# Patient Record
Sex: Male | Born: 1970
Health system: Southern US, Community
[De-identification: ages and names within clinical notes are randomized; demographics above are authoritative.]

## PROBLEM LIST (undated history)

## (undated) DIAGNOSIS — I10 Essential (primary) hypertension: Secondary | ICD-10-CM

## (undated) DIAGNOSIS — M1990 Unspecified osteoarthritis, unspecified site: Secondary | ICD-10-CM

## (undated) DIAGNOSIS — Z87442 Personal history of urinary calculi: Secondary | ICD-10-CM

## (undated) DIAGNOSIS — K219 Gastro-esophageal reflux disease without esophagitis: Secondary | ICD-10-CM

---

## 2002-11-08 ENCOUNTER — Emergency Department (HOSPITAL_COMMUNITY): Admission: EM | Admit: 2002-11-08 | Discharge: 2002-11-08 | Payer: Self-pay | Admitting: Emergency Medicine

## 2011-07-08 DIAGNOSIS — R1110 Vomiting, unspecified: Secondary | ICD-10-CM | POA: Insufficient documentation

## 2013-03-08 DIAGNOSIS — R059 Cough, unspecified: Secondary | ICD-10-CM | POA: Insufficient documentation

## 2013-12-17 DIAGNOSIS — K219 Gastro-esophageal reflux disease without esophagitis: Secondary | ICD-10-CM | POA: Insufficient documentation

## 2013-12-17 DIAGNOSIS — J309 Allergic rhinitis, unspecified: Secondary | ICD-10-CM | POA: Insufficient documentation

## 2014-01-16 DIAGNOSIS — M25569 Pain in unspecified knee: Secondary | ICD-10-CM | POA: Insufficient documentation

## 2014-07-30 ENCOUNTER — Encounter (HOSPITAL_COMMUNITY): Payer: Self-pay | Admitting: *Deleted

## 2014-07-30 ENCOUNTER — Emergency Department (HOSPITAL_COMMUNITY)
Admission: EM | Admit: 2014-07-30 | Discharge: 2014-07-30 | Disposition: A | Discharge status: Home / Self Care | Payer: BLUE CROSS/BLUE SHIELD | Attending: Emergency Medicine | Admitting: Emergency Medicine

## 2014-07-30 ENCOUNTER — Emergency Department (HOSPITAL_COMMUNITY): Payer: BLUE CROSS/BLUE SHIELD

## 2014-07-30 DIAGNOSIS — Z79899 Other long term (current) drug therapy: Secondary | ICD-10-CM | POA: Diagnosis not present

## 2014-07-30 DIAGNOSIS — R079 Chest pain, unspecified: Secondary | ICD-10-CM | POA: Diagnosis present

## 2014-07-30 DIAGNOSIS — M549 Dorsalgia, unspecified: Secondary | ICD-10-CM | POA: Diagnosis not present

## 2014-07-30 DIAGNOSIS — E669 Obesity, unspecified: Secondary | ICD-10-CM | POA: Insufficient documentation

## 2014-07-30 DIAGNOSIS — K219 Gastro-esophageal reflux disease without esophagitis: Secondary | ICD-10-CM

## 2014-07-30 HISTORY — DX: Gastro-esophageal reflux disease without esophagitis: K21.9

## 2014-07-30 LAB — BASIC METABOLIC PANEL
ANION GAP: 5 (ref 5–15)
BUN: 14 mg/dL (ref 6–23)
CO2: 29 mmol/L (ref 19–32)
Calcium: 9.1 mg/dL (ref 8.4–10.5)
Chloride: 107 mmol/L (ref 96–112)
Creatinine, Ser: 0.98 mg/dL (ref 0.50–1.35)
GFR calc Af Amer: >90 mL/min (ref >90)
GFR calc non Af Amer: >90 mL/min (ref >90)
Glucose, Bld: 107 mg/dL — ABNORMAL HIGH (ref 70–99)
Potassium: 3.6 mmol/L (ref 3.5–5.1)
Sodium: 141 mmol/L (ref 135–145)

## 2014-07-30 LAB — CBC
HCT: 43.3 % (ref 39.0–52.0)
Hemoglobin: 14.5 g/dL (ref 13.0–17.0)
MCH: 28.8 pg (ref 26.0–34.0)
MCHC: 33.5 g/dL (ref 30.0–36.0)
MCV: 86.1 fL (ref 78.0–100.0)
Platelets: 235 10*3/uL (ref 150–400)
RBC: 5.03 MIL/uL (ref 4.22–5.81)
RDW: 13 % (ref 11.5–15.5)
WBC: 7.6 10*3/uL (ref 4.0–10.5)

## 2014-07-30 LAB — HEPATIC FUNCTION PANEL
ALK PHOS: 60 U/L (ref 39–117)
ALT: 25 U/L (ref 0–53)
AST: 22 U/L (ref 0–37)
Albumin: 4.3 g/dL (ref 3.5–5.2)
BILIRUBIN DIRECT: 0.1 mg/dL (ref 0.0–0.5)
BILIRUBIN TOTAL: 0.7 mg/dL (ref 0.3–1.2)
Indirect Bilirubin: 0.6 mg/dL (ref 0.3–0.9)
Total Protein: 7.4 g/dL (ref 6.0–8.3)

## 2014-07-30 LAB — TROPONIN I

## 2014-07-30 LAB — LIPASE, BLOOD: Lipase: 29 U/L (ref 11–59)

## 2014-07-30 MED ORDER — FAMOTIDINE IN NACL 20-0.9 MG/50ML-% IV SOLN
20.0000 mg | Freq: Once | INTRAVENOUS | Status: AC
  Administered 2014-07-30: 20 mg via INTRAVENOUS
  Filled 2014-07-30: qty 50

## 2014-07-30 MED ORDER — GI COCKTAIL ~~LOC~~
30.0000 mL | Freq: Once | ORAL | Status: AC
  Administered 2014-07-30: 30 mL via ORAL
  Filled 2014-07-30: qty 30

## 2014-07-30 MED ORDER — ONDANSETRON HCL 4 MG/2ML IJ SOLN
4.0000 mg | Freq: Once | INTRAMUSCULAR | Status: AC
  Administered 2014-07-30: 4 mg via INTRAVENOUS

## 2014-07-30 MED ORDER — PROMETHAZINE HCL 25 MG PO TABS: 25.0000 mg | ORAL_TABLET | Freq: Four times a day (QID) | ORAL | Status: DC | PRN

## 2014-07-30 MED ORDER — PROMETHAZINE HCL 12.5 MG PO TABS
25.0000 mg | ORAL_TABLET | Freq: Once | ORAL | Status: AC
  Administered 2014-07-30: 25 mg via ORAL

## 2014-07-30 MED ORDER — PROMETHAZINE HCL 12.5 MG PO TABS
ORAL_TABLET | ORAL | Status: AC
  Filled 2014-07-30: qty 2

## 2014-07-30 MED ORDER — ONDANSETRON HCL 4 MG/2ML IJ SOLN
INTRAMUSCULAR | Status: AC
  Filled 2014-07-30: qty 2

## 2014-07-30 NOTE — ED Provider Notes (Signed)
CSN: [id number]     Arrival date & time 07/30/14  0759 History   First MD Initiated Contact with Patient 07/30/14 [phone]     Chief Complaint  Patient presents with  . Chest Pain     (Consider location/radiation/quality/duration/timing/severity/associated sxs/prior Treatment) The history is provided by the patient and the spouse.   Cory Christensen is a 44 y.o. male with a history of GERD and family history of early CAD (father with MI at age 78) waking around 4:30 today with midsternal discomfort which radiates into his mid back.  He denies nausea, but had an episode of emesis without warning while driving to work.  He denies diaphoresis, shortness of breath, abdominal pain.  He has taken his prilosec this am and denies GERD symptoms.  His symptoms are somewhat positional, but constant. He reports lifting a washing machine yesterday with ease (and assistance).  Does not believe his symptoms are from muscle strain.  He has found no alleviators.  No surgical history, nonsmoker.    Past Medical History  Diagnosis Date  . GERD (gastroesophageal reflux disease)    History reviewed. No pertinent past surgical history. No family history on file. History  Substance Use Topics  . Smoking status: Never Smoker   . Smokeless tobacco: Not on file  . Alcohol Use: No    Review of Systems  Constitutional: Negative for fever.  HENT: Negative for congestion and sore throat.   Eyes: Negative.   Respiratory: Negative for cough, chest tightness and shortness of breath.   Cardiovascular: Positive for chest pain.  Gastrointestinal: Positive for vomiting. Negative for nausea and abdominal pain.  Genitourinary: Negative.   Musculoskeletal: Positive for back pain. Negative for joint swelling, arthralgias and neck pain.  Skin: Negative.  Negative for rash and wound.  Neurological: Negative for dizziness, weakness, light-headedness, numbness and headaches.  Psychiatric/Behavioral: Negative.        Allergies  Review of patient's allergies indicates no known allergies.  Home Medications   Prior to Admission medications   Medication Sig Start Date End Date Taking? Authorizing Provider  omeprazole (PRILOSEC OTC) 20 MG tablet Take 20 mg by mouth daily.   Yes Historical Provider, MD  promethazine (PHENERGAN) 25 MG tablet Take 1 tablet (25 mg total) by mouth every 6 (six) hours as needed for nausea or vomiting. 07/30/14   Evalee Jefferson, PA-C   BP 141/76 mmHg  Pulse 59  Temp(Src) 98.1 F (36.7 C) (Oral)  Resp 99  Ht 6\' 3"  (1.905 m)  Wt 310 lb (140.615 kg)  BMI 38.75 kg/m2  SpO2 99% Physical Exam  Constitutional: He appears well-developed and well-nourished.  obese  HENT:  Head: Normocephalic and atraumatic.  Eyes: Conjunctivae are normal.  Neck: Normal range of motion.  Cardiovascular: Normal rate, regular rhythm, normal heart sounds and intact distal pulses.   Pulmonary/Chest: Effort normal and breath sounds normal. He has no wheezes. He has no rales. He exhibits tenderness.  Mild ttp midsternum.  No reproducible back pain.  Abdominal: Soft. Bowel sounds are normal. There is no tenderness.  Musculoskeletal: Normal range of motion. He exhibits no edema or tenderness.  Neurological: He is alert.  Skin: Skin is warm and dry.  Psychiatric: He has a normal mood and affect.  Nursing note and vitals reviewed.   ED Course  Procedures (including critical care time) Labs Review Labs Reviewed  BASIC METABOLIC PANEL - Abnormal; Notable for the following:    Glucose, Bld 107 (*)    All  other components within normal limits  CBC  TROPONIN I  HEPATIC FUNCTION PANEL  LIPASE, BLOOD    Imaging Review Dg Chest 2 View  07/30/2014   CLINICAL DATA:  Central chest pain for 1 day  EXAM: CHEST  2 VIEW  COMPARISON:  None.  FINDINGS: Lungs are clear. Heart size and pulmonary vascularity are normal. No adenopathy. No bone lesions. No pneumothorax.  IMPRESSION: No edema or consolidation.    Electronically Signed   By: Lowella Grip III M.D.   On: 07/30/2014 08:43     EKG Interpretation   Date/Time:  Wednesday July 30 2014 08:04:05 EST Ventricular Rate:  70 PR Interval:  133 QRS Duration: 107 QT Interval:  407 QTC Calculation: 439 R Axis:   89 Text Interpretation:  Sinus rhythm Baseline wander in lead(s) V6 Confirmed  by ZAMMIT  MD, JOSEPH [phone]) on 07/30/2014 8:19:30 AM      MDM   Final diagnoses:  Gastroesophageal reflux disease, esophagitis presence not specified    Emesis x 1 after arrival, zofran given with no further vomiting, no nausea.  Still with discomfort, GI cocktail given with complete relief of chest and back sx.  Suspect GERD with possible esophagitis, especially given back sx.  He was encouraged to increase prilosec to bid.  Add maalox or mylanta per label instructions for breakthrough sx.  F/u with pcp if sx persist beyond the next week.  Labs reviewed and negative.  Pt was seen by Dr Roderic Palau during this visit.  Discharge VS not correct - respiratory rate reflects 99, pt in no respiratory distress during visit or at dc.    Evalee Jefferson, PA-C 07/30/14 2055  Maudry Diego, MD 08/01/14 [phone]

## 2014-07-30 NOTE — ED Notes (Signed)
Orders received from J. Idol, EDPa.

## 2014-07-30 NOTE — ED Notes (Signed)
Pt states pain between the shoulder blades when he woke up this morning. PT states he vomited x 1 on the way to work. States chest pain moves around according to which way he is lying. NAD.

## 2014-07-30 NOTE — ED Notes (Signed)
Pt reports increased pain with some position changes and mvmts.

## 2014-07-30 NOTE — ED Notes (Signed)
Pt up for discharge but has started vomiting again.

## 2014-07-30 NOTE — Discharge Instructions (Signed)
Gastroesophageal Reflux Disease, Adult Gastroesophageal reflux disease (GERD) happens when acid from your stomach flows up into the esophagus. When acid comes in contact with the esophagus, the acid causes soreness (inflammation) in the esophagus. Over time, GERD may create small holes (ulcers) in the lining of the esophagus. CAUSES   Increased body weight. This puts pressure on the stomach, making acid rise from the stomach into the esophagus.  Smoking. This increases acid production in the stomach.  Drinking alcohol. This causes decreased pressure in the lower esophageal sphincter (valve or ring of muscle between the esophagus and stomach), allowing acid from the stomach into the esophagus.  Late evening meals and a full stomach. This increases pressure and acid production in the stomach.  A malformed lower esophageal sphincter. Sometimes, no cause is found. SYMPTOMS   Burning pain in the lower part of the mid-chest behind the breastbone and in the mid-stomach area. This may occur twice a week or more often.  Trouble swallowing.  Sore throat.  Dry cough.  Asthma-like symptoms including chest tightness, shortness of breath, or wheezing. DIAGNOSIS  Your caregiver may be able to diagnose GERD based on your symptoms. In some cases, X-rays and other tests may be done to check for complications or to check the condition of your stomach and esophagus. TREATMENT  Your caregiver may recommend over-the-counter or prescription medicines to help decrease acid production. Ask your caregiver before starting or adding any new medicines.  HOME CARE INSTRUCTIONS   Change the factors that you can control. Ask your caregiver for guidance concerning weight loss, quitting smoking, and alcohol consumption.  Avoid foods and drinks that make your symptoms worse, such as:  Caffeine or alcoholic drinks.  Chocolate.  Peppermint or mint flavorings.  Garlic and onions.  Spicy foods.  Citrus fruits,  such as oranges, lemons, or limes.  Tomato-based foods such as sauce, chili, salsa, and pizza.  Fried and fatty foods.  Avoid lying down for the 3 hours prior to your bedtime or prior to taking a nap.  Eat small, frequent meals instead of large meals.  Wear loose-fitting clothing. Do not wear anything tight around your waist that causes pressure on your stomach.  Raise the head of your bed 6 to 8 inches with wood blocks to help you sleep. Extra pillows will not help.  Only take over-the-counter or prescription medicines for pain, discomfort, or fever as directed by your caregiver.  Do not take aspirin, ibuprofen, or other nonsteroidal anti-inflammatory drugs (NSAIDs). SEEK IMMEDIATE MEDICAL CARE IF:   You have pain in your arms, neck, jaw, teeth, or back.  Your pain increases or changes in intensity or duration.  You develop nausea, vomiting, or sweating (diaphoresis).  You develop shortness of breath, or you faint.  Your vomit is green, yellow, black, or looks like coffee grounds or blood.  Your stool is red, bloody, or black. These symptoms could be signs of other problems, such as heart disease, gastric bleeding, or esophageal bleeding. MAKE SURE YOU:   Understand these instructions.  Will watch your condition.  Will get help right away if you are not doing well or get worse. Document Released: 03/09/2005 Document Revised: 08/22/2011 Document Reviewed: 12/17/2010 [facility] Patient Information 2015 [place], [place]. This information is not intended to replace advice given to you by your health care provider. Make sure you discuss any questions you have with your health care provider.   I suggest taking your prilosec twice daily for the next 10 days.  Additionally,  you may add either maalox or mylanta per the label instructions for any breakthrough symptoms.  Followup with your doctor for a recheck if your symptoms persist beyond the next week.

## 2015-04-28 DIAGNOSIS — J209 Acute bronchitis, unspecified: Secondary | ICD-10-CM | POA: Insufficient documentation

## 2015-10-21 DIAGNOSIS — M25562 Pain in left knee: Secondary | ICD-10-CM | POA: Diagnosis not present

## 2015-10-21 DIAGNOSIS — Z6841 Body Mass Index (BMI) 40.0 and over, adult: Secondary | ICD-10-CM | POA: Diagnosis not present

## 2015-10-29 DIAGNOSIS — M25562 Pain in left knee: Secondary | ICD-10-CM | POA: Diagnosis not present

## 2015-11-12 DIAGNOSIS — M25562 Pain in left knee: Secondary | ICD-10-CM | POA: Diagnosis not present

## 2015-11-12 DIAGNOSIS — G8929 Other chronic pain: Secondary | ICD-10-CM | POA: Diagnosis not present

## 2015-11-18 DIAGNOSIS — M84362A Stress fracture, left tibia, initial encounter for fracture: Secondary | ICD-10-CM | POA: Diagnosis not present

## 2015-11-18 DIAGNOSIS — M25562 Pain in left knee: Secondary | ICD-10-CM | POA: Diagnosis not present

## 2015-11-21 DIAGNOSIS — T24111A Burn of first degree of right thigh, initial encounter: Secondary | ICD-10-CM | POA: Diagnosis not present

## 2015-11-21 DIAGNOSIS — T23202A Burn of second degree of left hand, unspecified site, initial encounter: Secondary | ICD-10-CM | POA: Diagnosis not present

## 2015-11-21 DIAGNOSIS — Z79899 Other long term (current) drug therapy: Secondary | ICD-10-CM | POA: Diagnosis not present

## 2015-11-21 DIAGNOSIS — T2010XA Burn of first degree of head, face, and neck, unspecified site, initial encounter: Secondary | ICD-10-CM | POA: Diagnosis not present

## 2015-11-21 DIAGNOSIS — T23232A Burn of second degree of multiple left fingers (nail), not including thumb, initial encounter: Secondary | ICD-10-CM | POA: Diagnosis not present

## 2015-11-24 DIAGNOSIS — T2020XA Burn of second degree of head, face, and neck, unspecified site, initial encounter: Secondary | ICD-10-CM | POA: Insufficient documentation

## 2015-11-24 DIAGNOSIS — T23202A Burn of second degree of left hand, unspecified site, initial encounter: Secondary | ICD-10-CM | POA: Diagnosis not present

## 2015-11-24 DIAGNOSIS — T24231A Burn of second degree of right lower leg, initial encounter: Secondary | ICD-10-CM | POA: Diagnosis not present

## 2015-11-24 DIAGNOSIS — T23232A Burn of second degree of multiple left fingers (nail), not including thumb, initial encounter: Secondary | ICD-10-CM | POA: Diagnosis not present

## 2015-12-08 DIAGNOSIS — T23232A Burn of second degree of multiple left fingers (nail), not including thumb, initial encounter: Secondary | ICD-10-CM | POA: Diagnosis not present

## 2015-12-08 DIAGNOSIS — T24231A Burn of second degree of right lower leg, initial encounter: Secondary | ICD-10-CM | POA: Diagnosis not present

## 2015-12-08 DIAGNOSIS — T2020XA Burn of second degree of head, face, and neck, unspecified site, initial encounter: Secondary | ICD-10-CM | POA: Diagnosis not present

## 2015-12-08 DIAGNOSIS — T23202A Burn of second degree of left hand, unspecified site, initial encounter: Secondary | ICD-10-CM | POA: Diagnosis not present

## 2015-12-16 DIAGNOSIS — M25562 Pain in left knee: Secondary | ICD-10-CM | POA: Diagnosis not present

## 2015-12-16 DIAGNOSIS — M84362D Stress fracture, left tibia, subsequent encounter for fracture with routine healing: Secondary | ICD-10-CM | POA: Diagnosis not present

## 2016-02-04 DIAGNOSIS — K210 Gastro-esophageal reflux disease with esophagitis: Secondary | ICD-10-CM | POA: Diagnosis not present

## 2016-02-04 DIAGNOSIS — R05 Cough: Secondary | ICD-10-CM | POA: Diagnosis not present

## 2016-02-04 DIAGNOSIS — Z6841 Body Mass Index (BMI) 40.0 and over, adult: Secondary | ICD-10-CM | POA: Diagnosis not present

## 2016-03-16 DIAGNOSIS — Z23 Encounter for immunization: Secondary | ICD-10-CM | POA: Diagnosis not present

## 2016-04-05 ENCOUNTER — Encounter (HOSPITAL_COMMUNITY): Payer: Self-pay | Admitting: Emergency Medicine

## 2016-04-05 ENCOUNTER — Emergency Department (HOSPITAL_COMMUNITY)
Admission: EM | Admit: 2016-04-05 | Discharge: 2016-04-05 | Disposition: A | Discharge status: Home / Self Care | Payer: BLUE CROSS/BLUE SHIELD | Attending: Emergency Medicine | Admitting: Emergency Medicine

## 2016-04-05 ENCOUNTER — Emergency Department (HOSPITAL_COMMUNITY): Payer: BLUE CROSS/BLUE SHIELD

## 2016-04-05 DIAGNOSIS — N200 Calculus of kidney: Secondary | ICD-10-CM | POA: Insufficient documentation

## 2016-04-05 DIAGNOSIS — Z79899 Other long term (current) drug therapy: Secondary | ICD-10-CM | POA: Diagnosis not present

## 2016-04-05 DIAGNOSIS — R109 Unspecified abdominal pain: Secondary | ICD-10-CM | POA: Diagnosis present

## 2016-04-05 DIAGNOSIS — N201 Calculus of ureter: Secondary | ICD-10-CM | POA: Diagnosis not present

## 2016-04-05 LAB — URINALYSIS, ROUTINE W REFLEX MICROSCOPIC
GLUCOSE, UA: NEGATIVE mg/dL
Ketones, ur: NEGATIVE mg/dL
LEUKOCYTES UA: NEGATIVE
Nitrite: NEGATIVE
PH: 6 (ref 5.0–8.0)
Protein, ur: 30 mg/dL — AB
Specific Gravity, Urine: >1.03 — ABNORMAL HIGH (ref 1.005–1.030)

## 2016-04-05 LAB — URINE MICROSCOPIC-ADD ON: Squamous Epithelial / LPF: NONE SEEN

## 2016-04-05 LAB — COMPREHENSIVE METABOLIC PANEL
ALBUMIN: 4.5 g/dL (ref 3.5–5.0)
ALT: 28 U/L (ref 17–63)
AST: 27 U/L (ref 15–41)
Alkaline Phosphatase: 58 U/L (ref 38–126)
Anion gap: 10 (ref 5–15)
BUN: 15 mg/dL (ref 6–20)
CO2: 24 mmol/L (ref 22–32)
Calcium: 9.1 mg/dL (ref 8.9–10.3)
Chloride: 104 mmol/L (ref 101–111)
Creatinine, Ser: 1.22 mg/dL (ref 0.61–1.24)
GFR calc Af Amer: >60 mL/min (ref >60)
GFR calc non Af Amer: >60 mL/min (ref >60)
GLUCOSE: 166 mg/dL — AB (ref 65–99)
Potassium: 3.5 mmol/L (ref 3.5–5.1)
SODIUM: 138 mmol/L (ref 135–145)
Total Bilirubin: 0.8 mg/dL (ref 0.3–1.2)
Total Protein: 7.8 g/dL (ref 6.5–8.1)

## 2016-04-05 LAB — CBC WITH DIFFERENTIAL/PLATELET
BASOS ABS: 0 10*3/uL (ref 0.0–0.1)
Basophils Relative: 0 %
EOS ABS: 0.2 10*3/uL (ref 0.0–0.7)
Eosinophils Relative: 2 %
HEMATOCRIT: 44.7 % (ref 39.0–52.0)
HEMOGLOBIN: 15.3 g/dL (ref 13.0–17.0)
Lymphocytes Relative: 18 %
Lymphs Abs: 1.9 10*3/uL (ref 0.7–4.0)
MCH: 29.1 pg (ref 26.0–34.0)
MCHC: 34.2 g/dL (ref 30.0–36.0)
MCV: 85.1 fL (ref 78.0–100.0)
MONO ABS: 0.8 10*3/uL (ref 0.1–1.0)
MONOS PCT: 8 %
NEUTROS PCT: 72 %
Neutro Abs: 7.4 10*3/uL (ref 1.7–7.7)
Platelets: 274 10*3/uL (ref 150–400)
RBC: 5.25 MIL/uL (ref 4.22–5.81)
RDW: 13.1 % (ref 11.5–15.5)
WBC: 10.3 10*3/uL (ref 4.0–10.5)

## 2016-04-05 LAB — LIPASE, BLOOD: Lipase: 25 U/L (ref 11–51)

## 2016-04-05 MED ORDER — IBUPROFEN 600 MG PO TABS: 600.0000 mg | ORAL_TABLET | Freq: Four times a day (QID) | ORAL | 1 refills | Status: DC | PRN

## 2016-04-05 MED ORDER — MORPHINE SULFATE (PF) 2 MG/ML IV SOLN: 1.0000 mg | Freq: Once | INTRAVENOUS | Status: DC

## 2016-04-05 MED ORDER — MORPHINE SULFATE (PF) 2 MG/ML IV SOLN
4.0000 mg | Freq: Once | INTRAVENOUS | Status: AC
  Administered 2016-04-05: 4 mg via INTRAVENOUS
  Filled 2016-04-05: qty 2

## 2016-04-05 MED ORDER — ONDANSETRON HCL 4 MG/2ML IJ SOLN
4.0000 mg | Freq: Once | INTRAMUSCULAR | Status: AC
  Administered 2016-04-05: 4 mg via INTRAVENOUS
  Filled 2016-04-05: qty 2

## 2016-04-05 MED ORDER — MORPHINE SULFATE (PF) 2 MG/ML IV SOLN: 2.0000 mg | Freq: Once | INTRAVENOUS | Status: DC

## 2016-04-05 MED ORDER — SODIUM CHLORIDE 0.9 % IV SOLN
1000.0000 mL | Freq: Once | INTRAVENOUS | Status: AC
  Administered 2016-04-05: 1000 mL via INTRAVENOUS

## 2016-04-05 MED ORDER — KETOROLAC TROMETHAMINE 30 MG/ML IJ SOLN
30.0000 mg | Freq: Once | INTRAMUSCULAR | Status: AC
  Administered 2016-04-05: 30 mg via INTRAVENOUS
  Filled 2016-04-05: qty 1

## 2016-04-05 MED ORDER — OXYCODONE-ACETAMINOPHEN 5-325 MG PO TABS: 1.0000 | ORAL_TABLET | ORAL | 0 refills | Status: DC | PRN

## 2016-04-05 MED ORDER — PROMETHAZINE HCL 25 MG/ML IJ SOLN
25.0000 mg | Freq: Once | INTRAMUSCULAR | Status: AC
  Administered 2016-04-05: 25 mg via INTRAVENOUS
  Filled 2016-04-05: qty 1

## 2016-04-05 MED ORDER — TAMSULOSIN HCL 0.4 MG PO CAPS: 0.4000 mg | ORAL_CAPSULE | Freq: Every day | ORAL | 0 refills | Status: DC

## 2016-04-05 MED ORDER — ONDANSETRON 8 MG PO TBDP: 8.0000 mg | ORAL_TABLET | Freq: Three times a day (TID) | ORAL | 0 refills | Status: DC | PRN

## 2016-04-05 NOTE — ED Triage Notes (Signed)
Patient complains of lower abdominal pain. States pain radiates to left lower back and groin. States vomiting 6 times this morning. States unable to void. Denies diarrhea, fever.

## 2016-04-05 NOTE — ED Notes (Signed)
Pt given ginger ale.

## 2016-04-05 NOTE — ED Provider Notes (Signed)
[facility] DEPT Provider Note   CSN: [id number] Arrival date & time: 04/05/16  0700     History   Chief Complaint Chief Complaint  Patient presents with  . Abdominal Pain    HPI Cory Christensen is a 45 year old man with history of GERD.  HPI  He presents with abdominal pain. Woke up this morning with pain and nausea, (nonbloody, nonbilious) vomiting. Pain is sharp and located in left flank and groin. Intermittent. No relieving factors. Riding in the car made the pain worse. Never had pain like this before. He is having trouble voiding. Some testicular pain but most pain is in flank. He was in [place] last week. No sick contacts.   Past Medical History:  Diagnosis Date  . GERD (gastroesophageal reflux disease)     History reviewed. No pertinent surgical history.   Home Medications    Prior to Admission medications   Medication Sig Start Date End Date Taking? Authorizing Provider  ibuprofen (ADVIL,MOTRIN) 600 MG tablet Take 1 tablet (600 mg total) by mouth every 6 (six) hours as needed. 04/05/16   Milagros Loll, MD  omeprazole (PRILOSEC OTC) 20 MG tablet Take 20 mg by mouth daily.    Historical Provider, MD  ondansetron (ZOFRAN-ODT) 8 MG disintegrating tablet Take 1 tablet (8 mg total) by mouth every 8 (eight) hours as needed for nausea or vomiting. 04/05/16   Milagros Loll, MD  oxyCODONE-acetaminophen (PERCOCET/ROXICET) 5-325 MG tablet Take 1-2 tablets by mouth every 4 (four) hours as needed for severe pain. 04/05/16   Milagros Loll, MD  promethazine (PHENERGAN) 25 MG tablet Take 1 tablet (25 mg total) by mouth every 6 (six) hours as needed for nausea or vomiting. 07/30/14   Evalee Jefferson, PA-C  tamsulosin (FLOMAX) 0.4 MG CAPS capsule Take 1 capsule (0.4 mg total) by mouth daily after supper. 04/05/16   Milagros Loll, MD   Family History No family history on file.  Social History Social History  Substance Use Topics  . Smoking status: Never Smoker  .  Smokeless tobacco: Never Used  . Alcohol use No     Allergies   Review of patient's allergies indicates no known allergies.   Review of Systems Review of Systems Constitutional: +subjective fevers Eyes: no vision changes Ears, nose, mouth, throat, and face: no cough Respiratory: no shortness of breath Cardiovascular: no chest pain Gastrointestinal: see HPI, no constipation, no diarrhea Genitourinary: no dysuria, no hematuria Integument: no rash Hematologic/lymphatic: no bleeding/bruising, no edema Musculoskeletal: no arthralgias, no myalgias Neurological: no paresthesias, no weakness   Physical Exam Updated Vital Signs BP 143/93 (BP Location: Left Arm)   Pulse 77   Temp 97.6 F (36.4 C) (Oral)   Resp 18   Ht 6\' 3"  (1.905 m)   Wt (!) 140.6 kg   SpO2 92%   BMI 38.75 kg/m   Physical Exam Christensen Apperance: NAD Head: Normocephalic, atraumatic Eyes: PERRL, EOMI, anicteric sclera Ears: Normal external ear canal Nose: Nares normal, septum midline, mucosa normal Throat: Lips, mucosa and tongue normal  Neck: Supple, trachea midline Back: No tenderness or bony abnormality  Lungs: Clear to auscultation bilaterally. No wheezes, rhonchi or rales. Breathing comfortably Chest Wall: Nontender, no deformity Heart: Regular rate and rhythm, no murmur/rub/gallop Abdomen: Soft, tender to palpation left abdomen/flank, nondistended, no rebound Extremities: Normal, atraumatic, warm and well perfused, no edema Pulses: 2+ throughout Skin: No rashes or lesions Neurologic: Alert and oriented x 3. CNII-XII intact. Normal strength and sensation  ED Treatments / Results  Labs (all labs ordered are listed, but only abnormal results are displayed) Labs Reviewed  COMPREHENSIVE METABOLIC PANEL - Abnormal; Notable for the following:       Result Value   Glucose, Bld 166 (*)    All other components within normal limits  URINALYSIS, ROUTINE W REFLEX MICROSCOPIC (NOT AT [facility]) - Abnormal;  Notable for the following:    Specific Gravity, Urine >1.030 (*)    Hgb urine dipstick LARGE (*)    Bilirubin Urine SMALL (*)    Protein, ur 30 (*)    All other components within normal limits  URINE MICROSCOPIC-ADD ON - Abnormal; Notable for the following:    Bacteria, UA FEW (*)    All other components within normal limits  CBC WITH DIFFERENTIAL/PLATELET  LIPASE, BLOOD    Radiology Ct Renal Stone Study  Result Date: 04/05/2016 CLINICAL DATA:  Low back pain that  radiates to back. EXAM: CT ABDOMEN AND PELVIS WITHOUT CONTRAST TECHNIQUE: Multidetector CT imaging of the abdomen and pelvis was performed following the standard protocol without IV contrast. COMPARISON:  None. FINDINGS: Lower chest: Lung bases are clear. Hepatobiliary: No focal hepatic lesions noncontrast gallstones without evidence acute cholecystitis. Pancreas: Pancreas is normal. No ductal dilatation. No pancreatic inflammation. Spleen: Normal spleen Adrenals/Urinary Tract: Adrenal glands, kidneys, adrenal glands are normal. There is mild renal edema on the LEFT. Mild hydroureter on the LEFT. This secondary to partially obstructing calculus in the distal LEFT ureter measuring 3 mm. This calculus is approximately 3 cm from the vascular junction. No bladder calculi Stomach/Bowel: Stomach, small bowel, appendix, and cecum are normal. The colon and rectosigmoid colon are normal. Vascular/Lymphatic: Abdominal aorta is normal caliber. There is no retroperitoneal or periportal lymphadenopathy. No pelvic lymphadenopathy. Reproductive: Prostate normal. Other: No free fluid the pelvis. Musculoskeletal: No aggressive osseous lesion. IMPRESSION: 1. Partially obstructing calculus within distal LEFT ureter approximately 3 cm from the vesicoureteral junction. 2. No nephrolithiasis. Electronically Signed   By: Suzy Bouchard M.D.   On: 04/05/2016 08:57   Procedures  Medications Ordered in ED Medications  ketorolac (TORADOL) 30 MG/ML injection  30 mg (30 mg Intravenous Given 04/05/16 0729)  promethazine (PHENERGAN) injection 25 mg (25 mg Intravenous Given 04/05/16 0730)  0.9 %  sodium chloride infusion (0 mLs Intravenous Stopped 04/05/16 0905)  morphine 2 MG/ML injection 4 mg (4 mg Intravenous Given 04/05/16 0744)  ondansetron (ZOFRAN) injection 4 mg (4 mg Intravenous Given 04/05/16 0849)  ondansetron (ZOFRAN) injection 4 mg (4 mg Intravenous Given 04/05/16 0951)     Initial Impression / Assessment and Plan / ED Course  I have reviewed the triage vital signs and the nursing notes.  Pertinent labs & imaging results that were available during my care of the patient were reviewed by me and considered in my medical decision making (see chart for details).  Clinical Course  45 year old man with history of GERD presenting with sudden onset of nausea/vomiting, left flank/groin pain that started this morning. Tender to palpation long left flank. CMP and lipase unremarkable. CBC unremarkable. Patient is afebrile with no leukocytosis. No sepsis. UA with negative leukocytes and nitrites. Numerous RBC. CT renal protocol with partially obstructing, 45mm calculus within distal left ureter 3cm from vesicoureteral junction. Pain was controlled with 30mg  of IV Toradol, 4mg  of IV morphine. He received 25mg  of phenergan and 8mg  of Zofran. He was able to tolerate PO intake.   Final Clinical Impressions(s) / ED Diagnoses   Final diagnoses:  Renal  stone  59mm calculus at distal left ureter. Will likely pass spontaneously. Discharge home. Will have patient strain urine. Flomax 0.4mg  daily to aid in passage of stone. Ibuprofen for pain. Percocet for breakthrough pain. Zofran for nausea. Follow up with urology.  New Prescriptions New Prescriptions   IBUPROFEN (ADVIL,MOTRIN) 600 MG TABLET    Take 1 tablet (600 mg total) by mouth every 6 (six) hours as needed.   ONDANSETRON (ZOFRAN-ODT) 8 MG DISINTEGRATING TABLET    Take 1 tablet (8 mg total) by mouth every 8  (eight) hours as needed for nausea or vomiting.   OXYCODONE-ACETAMINOPHEN (PERCOCET/ROXICET) 5-325 MG TABLET    Take 1-2 tablets by mouth every 4 (four) hours as needed for severe pain.   TAMSULOSIN (FLOMAX) 0.4 MG CAPS CAPSULE    Take 1 capsule (0.4 mg total) by mouth daily after supper.     Milagros Loll, MD 04/05/16 [place], MD 04/05/16 [phone]

## 2016-04-05 NOTE — ED Notes (Signed)
Pt keeping fluids down at this time.

## 2016-04-05 NOTE — Discharge Instructions (Addendum)
Strain urine through the provided strainer. The stone may be as small as a grain of salt. Take this to your doctor. Take tamsulosin (Flomax) in the evenings. This will the stone to pass.  Take ibuprofen every 6 hours for your pain. If your pain is not controlled by the ibuprofen you can take Percocet (oxycodone-acetaminophen) in between. You may take Zofran (ondansetron) for nausea/vomiting.

## 2016-04-05 NOTE — ED Notes (Signed)
Pt given strainer and a urine cup to collect any stone that may pass.

## 2016-07-15 DIAGNOSIS — Z0000 Encounter for general adult medical examination without abnormal findings: Secondary | ICD-10-CM | POA: Diagnosis not present

## 2016-07-17 DIAGNOSIS — K2100 Gastro-esophageal reflux disease with esophagitis, without bleeding: Secondary | ICD-10-CM | POA: Insufficient documentation

## 2016-07-18 DIAGNOSIS — Z6841 Body Mass Index (BMI) 40.0 and over, adult: Secondary | ICD-10-CM | POA: Diagnosis not present

## 2016-07-18 DIAGNOSIS — Z0000 Encounter for general adult medical examination without abnormal findings: Secondary | ICD-10-CM | POA: Diagnosis not present

## 2016-07-18 DIAGNOSIS — Z1212 Encounter for screening for malignant neoplasm of rectum: Secondary | ICD-10-CM | POA: Diagnosis not present

## 2016-07-25 DIAGNOSIS — Z6841 Body Mass Index (BMI) 40.0 and over, adult: Secondary | ICD-10-CM | POA: Insufficient documentation

## 2016-07-26 DIAGNOSIS — Z6841 Body Mass Index (BMI) 40.0 and over, adult: Secondary | ICD-10-CM | POA: Diagnosis not present

## 2016-07-26 DIAGNOSIS — R05 Cough: Secondary | ICD-10-CM | POA: Diagnosis not present

## 2017-02-01 DIAGNOSIS — M25561 Pain in right knee: Secondary | ICD-10-CM | POA: Diagnosis not present

## 2017-02-01 DIAGNOSIS — M222X1 Patellofemoral disorders, right knee: Secondary | ICD-10-CM | POA: Diagnosis not present

## 2017-02-01 DIAGNOSIS — Z6841 Body Mass Index (BMI) 40.0 and over, adult: Secondary | ICD-10-CM | POA: Diagnosis not present

## 2017-03-14 DIAGNOSIS — M25561 Pain in right knee: Secondary | ICD-10-CM | POA: Diagnosis not present

## 2018-02-28 DIAGNOSIS — Z0000 Encounter for general adult medical examination without abnormal findings: Secondary | ICD-10-CM | POA: Diagnosis not present

## 2018-03-01 ENCOUNTER — Encounter: Payer: Self-pay | Admitting: Gastroenterology

## 2018-03-02 DIAGNOSIS — Z6841 Body Mass Index (BMI) 40.0 and over, adult: Secondary | ICD-10-CM | POA: Diagnosis not present

## 2018-03-02 DIAGNOSIS — Z0000 Encounter for general adult medical examination without abnormal findings: Secondary | ICD-10-CM | POA: Diagnosis not present

## 2018-03-15 ENCOUNTER — Encounter: Payer: Self-pay | Admitting: Pulmonary Disease

## 2018-03-15 ENCOUNTER — Ambulatory Visit (INDEPENDENT_AMBULATORY_CARE_PROVIDER_SITE_OTHER): Payer: BLUE CROSS/BLUE SHIELD | Admitting: Pulmonary Disease

## 2018-03-15 ENCOUNTER — Ambulatory Visit (INDEPENDENT_AMBULATORY_CARE_PROVIDER_SITE_OTHER)
Admission: RE | Admit: 2018-03-15 | Discharge: 2018-03-15 | Disposition: A | Discharge status: Home / Self Care | Payer: BLUE CROSS/BLUE SHIELD | Source: Ambulatory Visit | Attending: Pulmonary Disease | Admitting: Pulmonary Disease

## 2018-03-15 VITALS — BP 138/80 | HR 77 | Ht 73.0 in | Wt 329.6 lb

## 2018-03-15 DIAGNOSIS — R05 Cough: Secondary | ICD-10-CM

## 2018-03-15 DIAGNOSIS — J301 Allergic rhinitis due to pollen: Secondary | ICD-10-CM

## 2018-03-15 DIAGNOSIS — R059 Cough, unspecified: Secondary | ICD-10-CM

## 2018-03-15 DIAGNOSIS — Z23 Encounter for immunization: Secondary | ICD-10-CM | POA: Diagnosis not present

## 2018-03-15 DIAGNOSIS — K219 Gastro-esophageal reflux disease without esophagitis: Secondary | ICD-10-CM | POA: Diagnosis not present

## 2018-03-15 NOTE — Patient Instructions (Signed)
Cough: We will check a lung function test We will check a chest x-ray As described today there are many potential causes including acid reflux, allergic rhinitis  Gastroesophageal reflux disease: Continue Prilosec in the morning Start taking Pepcid at night  Allergic rhinitis: Take cetirizine daily 10 mg Take fluticasone nose spray 2 sprays each nostril daily for a month  We will see you back in 4 weeks to go over the results of the chest x-ray, lung function test and see if these interventions have made a difference with the cough.  If they have not then we may consider a sleep study at that point.

## 2018-03-15 NOTE — Progress Notes (Signed)
Synopsis: Referred in October 2019 for chronic cough  Subjective:   PATIENT ID: Cory Christensen GENDER: male DOB: 1971/01/12, MRN: [id number]   HPI  Chief Complaint  Patient presents with  . pulmonary consult    Chronic cough for "years" - occ prod usually clear - nothing seems to make it better or worse   This is a pleasant 47 year old male who works for a [employer] who comes to my clinic today for evaluation of a chronic cough.  He says that he had a normal childhood without respiratory illnesses and never suffered from pneumonia as a kid.  He says that he never had evidence of asthma that he is aware of either.  He never smoked cigarettes.  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  He was sent to the infirmary and was told that there was nothing wrong with his lungs.  He says he thinks he has been coughing ever since then.  He will occasionally produce mucus when he has more of a postnasal drip.  He says that he is having that right now but this is not something that he will experience for most of the year.  He says in the summertime for example he will cough significantly but he will not have a significant postnasal drip.  He has acid reflux and has been compliant with 40 mg of Prilosec daily for many years.  He says that this tends to control his acid reflux and he does not have symptoms of congestion, heartburn, acid in the back of his throat.  He denies problems of shortness of breath.  He does say that he feels sleepy during the daytime.  He frequently naps on the weekends.  He says that his wife tells him that he does not snore and she has never witnessed apneas.  He does not have a family history of lung problems.  Past Medical History:  Diagnosis Date  . GERD (gastroesophageal reflux disease)      History reviewed. No  pertinent family history.   Social History   Socioeconomic History  . Marital status: Married    Spouse name: Not on file  . Number of children: Not on file  . Years of education: Not on file  . Highest education level: Not on file  Occupational History  . Not on file  Social Needs  . Financial resource strain: Not on file  . Food insecurity:    Worry: Not on file    Inability: Not on file  . Transportation needs:    Medical: Not on file    Non-medical: Not on file  Tobacco Use  . Smoking status: Never Smoker  . Smokeless tobacco: Never Used  Substance and Sexual Activity  . Alcohol use: No  . Drug use: Not on file  . Sexual activity: Not on file  Lifestyle  . Physical activity:    Days per week: Not on file    Minutes per session: Not on file  . Stress: Not on file  Relationships  . Social connections:    Talks on phone: Not on file    Gets together: Not on file    Attends religious service: Not on file    Active member of club or organization: Not on file    Attends meetings of clubs or organizations: Not  on file    Relationship status: Not on file  . Intimate partner violence:    Fear of current or ex partner: Not on file    Emotionally abused: Not on file    Physically abused: Not on file    Forced sexual activity: Not on file  Other Topics Concern  . Not on file  Social History Narrative  . Not on file     No Known Allergies   Outpatient Medications Prior to Visit  Medication Sig Dispense Refill  . ibuprofen (ADVIL,MOTRIN) 600 MG tablet Take 1 tablet (600 mg total) by mouth every 6 (six) hours as needed. 30 tablet 1  . omeprazole (PRILOSEC OTC) 20 MG tablet Take 20 mg by mouth daily.    . ondansetron (ZOFRAN-ODT) 8 MG disintegrating tablet Take 1 tablet (8 mg total) by mouth every 8 (eight) hours as needed for nausea or vomiting. (Patient not taking: Reported on 03/15/2018) 20 tablet 0  . oxyCODONE-acetaminophen (PERCOCET/ROXICET) 5-325 MG tablet Take 1-2  tablets by mouth every 4 (four) hours as needed for severe pain. (Patient not taking: Reported on 03/15/2018) 20 tablet 0  . promethazine (PHENERGAN) 25 MG tablet Take 1 tablet (25 mg total) by mouth every 6 (six) hours as needed for nausea or vomiting. (Patient not taking: Reported on 03/15/2018) 15 tablet 0  . tamsulosin (FLOMAX) 0.4 MG CAPS capsule Take 1 capsule (0.4 mg total) by mouth daily after supper. (Patient not taking: Reported on 03/15/2018) 30 capsule 0   No facility-administered medications prior to visit.     Review of Systems  Constitutional: Negative for chills, fever, malaise/fatigue and weight loss.  HENT: Negative for congestion, nosebleeds, sinus pain and sore throat.   Eyes: Negative for photophobia, pain and discharge.  Respiratory: Negative for cough, hemoptysis, sputum production, shortness of breath and wheezing.   Cardiovascular: Negative for chest pain, palpitations, orthopnea and leg swelling.  Gastrointestinal: Positive for heartburn. Negative for abdominal pain, constipation, diarrhea, nausea and vomiting.  Genitourinary: Negative for dysuria, frequency, hematuria and urgency.  Musculoskeletal: Negative for back pain, joint pain, myalgias and neck pain.  Skin: Negative for itching and rash.  Neurological: Negative for tingling, tremors, sensory change, speech change, focal weakness, seizures, weakness and headaches.  Psychiatric/Behavioral: Negative for memory loss, substance abuse and suicidal ideas. The patient is not nervous/anxious.       Objective:  Physical Exam   Vitals:   03/15/18 1058  BP: 138/80  Pulse: 77  SpO2: 100%  Weight: (!) 329 lb 9.6 oz (149.5 kg)  Height: 6\' 1"  (1.854 m)    RA  Gen: obese but well appearing, no acute distress HENT: NCAT, OP clear, neck supple without masses Eyes: PERRL, EOMi Lymph: no cervical lymphadenopathy PULM: CTA B CV: RRR, no mgr, no JVD GI: BS+, soft, nontender, no hsm Derm: no rash or skin  breakdown MSK: normal bulk and tone Neuro: A&Ox4, CN II-XII intact, strength 5/5 in all 4 extremities Psyche: normal mood and affect   CBC    Component Value Date/Time   WBC 10.3 04/05/2016 0719   RBC 5.25 04/05/2016 0719   HGB 15.3 04/05/2016 0719   HCT 44.7 04/05/2016 0719   PLT 274 04/05/2016 0719   MCV 85.1 04/05/2016 0719   MCH 29.1 04/05/2016 0719   MCHC 34.2 04/05/2016 0719   RDW 13.1 04/05/2016 0719   LYMPHSABS 1.9 04/05/2016 0719   MONOABS 0.8 04/05/2016 0719   EOSABS 0.2 04/05/2016 0719   BASOSABS 0.0 04/05/2016 0719  Chest imaging: February 2016 chest x-ray images independently reviewed shows normal pulmonary parenchyma without abnormality  PFT:  Labs:  Path:  Echo:  Heart Catheterization:  Records from his visit with his primary care physician reviewed from yesterday where he was seen for cough.  He was continued on Prilosec daily and was referred to [place].     Assessment & Plan:   Cough - Plan: Pulmonary function test, DG Chest 2 View  Morbid obesity due to excess calories (HCC)  Gastroesophageal reflux disease, esophagitis presence not specified  Allergic rhinitis due to pollen, unspecified seasonality  Discussion: Cory Christensen comes to my clinic today for evaluation of a chronic cough.  This is in the setting of acid reflux with minimal symptoms from the same, some postnasal drip which seems to be seasonal and no other specific respiratory complaints.  I explained to him that there is many potential causes of coughing in his particular case I think is acid reflux, postnasal drip contribute.  I think there is also some degree of ongoing irritation from perpetual cough which leads to ongoing cough.  I think the best approach moving forward is to try to enhance his acid reflux treatment and postnasal drip treatment.  That being said I am not confident that these 2 changes will make a significant improvement in his symptoms because he has had a cough for so  long.  I think we may need to get a sleep study to see if there is evidence of obstructive sleep apnea because I have had patients from time to time who have had their cough cured with treatment of sleep apnea.  Of course, will check for evidence of an underlying lung disease with a lung function test and chest x-ray but given his lack of shortness of breath or other symptoms I doubt we will find a lung problem.  Plan: Cough: We will check a lung function test We will check a chest x-ray As described today there are many potential causes including acid reflux, allergic rhinitis  Gastroesophageal reflux disease: Continue Prilosec in the morning Start taking Pepcid at night  Allergic rhinitis: Take cetirizine daily 10 mg Take fluticasone nose spray 2 sprays each nostril daily for a month  We will see you back in 4 weeks to go over the results of the chest x-ray, lung function test and see if these interventions have made a difference with the cough.  If they have not then we may consider a sleep study at that point.    Current Outpatient Medications:  .  ibuprofen (ADVIL,MOTRIN) 600 MG tablet, Take 1 tablet (600 mg total) by mouth every 6 (six) hours as needed., Disp: 30 tablet, Rfl: 1 .  omeprazole (PRILOSEC OTC) 20 MG tablet, Take 20 mg by mouth daily., Disp: , Rfl:  .  ondansetron (ZOFRAN-ODT) 8 MG disintegrating tablet, Take 1 tablet (8 mg total) by mouth every 8 (eight) hours as needed for nausea or vomiting. (Patient not taking: Reported on 03/15/2018), Disp: 20 tablet, Rfl: 0 .  oxyCODONE-acetaminophen (PERCOCET/ROXICET) 5-325 MG tablet, Take 1-2 tablets by mouth every 4 (four) hours as needed for severe pain. (Patient not taking: Reported on 03/15/2018), Disp: 20 tablet, Rfl: 0 .  promethazine (PHENERGAN) 25 MG tablet, Take 1 tablet (25 mg total) by mouth every 6 (six) hours as needed for nausea or vomiting. (Patient not taking: Reported on 03/15/2018), Disp: 15 tablet, Rfl: 0 .   tamsulosin (FLOMAX) 0.4 MG CAPS capsule, Take 1 capsule (0.4 mg  total) by mouth daily after supper. (Patient not taking: Reported on 03/15/2018), Disp: 30 capsule, Rfl: 0

## 2018-03-16 ENCOUNTER — Telehealth: Payer: Self-pay | Admitting: Pulmonary Disease

## 2018-03-16 NOTE — Telephone Encounter (Signed)
Attempted to call patient back, no answer, left message to call back.  Results of CT: Notes recorded by Juanito Doom, MD on 03/15/2018 at 4:32 PM EDT BJ, Please let the patient know this was OK Thanks, B

## 2018-03-19 NOTE — Telephone Encounter (Signed)
LMTCB. Patient would like CT results.

## 2018-03-19 NOTE — Telephone Encounter (Signed)
Spoke with pt. He is aware of results. Nothing further was needed. 

## 2018-03-19 NOTE — Telephone Encounter (Signed)
Pt returning call. Pt contact [phone]

## 2018-04-09 ENCOUNTER — Ambulatory Visit (INDEPENDENT_AMBULATORY_CARE_PROVIDER_SITE_OTHER): Payer: Self-pay

## 2018-04-09 DIAGNOSIS — Z1211 Encounter for screening for malignant neoplasm of colon: Secondary | ICD-10-CM

## 2018-04-09 MED ORDER — NA SULFATE-K SULFATE-MG SULF 17.5-3.13-1.6 GM/177ML PO SOLN: 1.0000 | ORAL | 0 refills | Status: DC

## 2018-04-09 NOTE — Patient Instructions (Signed)
Debra Calabretta  11/13/1970 MRN: [id number]     Procedure Date: 06/08/18 Time to register: 8:30am Place to register: [facility] Procedure Time: 9:30am Scheduled provider: Barney Drain, MD    PREPARATION FOR COLONOSCOPY WITH SUPREP BOWEL PREP KIT  Note: Suprep Bowel Prep Kit is a split-dose (2day) regimen. Consumption of BOTH 6-ounce bottles is required for a complete prep.  Please notify us immediately if you are diabetic, take iron supplements, or if you are on Coumadin or any other blood thinners.                                                                                                                                                 1 DAY BEFORE PROCEDURE:  DATE: 06/07/18   DAY: Thursday  clear liquids the entire day - NO SOLID FOOD.   At 6:00pm: Complete steps 1 through 4 below, using ONE (1) 6-ounce bottle, before going to bed. Step 1:  Pour ONE (1) 6-ounce bottle of SUPREP liquid into the mixing container.  Step 2:  Add cool drinking water to the 16 ounce line on the container and mix.  Note: Dilute the solution concentrate as directed prior to use. Step 3:  DRINK ALL the liquid in the container. Step 4:  You MUST drink an additional two (2) or more 16 ounce containers of water over the next one (1) hour.   Continue clear liquids.  DAY OF PROCEDURE:   DATE: 06/08/18   DAY: Friday If you take medications for your heart, blood pressure, or breathing, you may take these medications.   5 hours before your procedure at :4:30am Step 1:  Pour ONE (1) 6-ounce bottle of SUPREP liquid into the mixing container.  Step 2:  Add cool drinking water to the 16 ounce line on the container and mix.  Note: Dilute the solution concentrate as directed prior to use. Step 3:  DRINK ALL the liquid in the container. Step 4:  You MUST drink an additional two (2) or more 16 ounce containers of water over the next one (1) hour. You MUST complete the final glass of water at least 3  hours before your colonoscopy.   Nothing by mouth past 6:30am  You may take your morning medications with sip of water unless we have instructed otherwise.    Please see below for Dietary Information.  CLEAR LIQUIDS INCLUDE:  Water Jello (NOT red in color)   Ice Popsicles (NOT red in color)   Tea (sugar ok, no milk/cream) Powdered fruit flavored drinks  Coffee (sugar ok, no milk/cream) Gatorade/ Lemonade/ Kool-Aid  (NOT red in color)   Juice: apple, white grape, white cranberry Soft drinks  Clear bullion, consomme, broth (fat free beef/chicken/vegetable)  Carbonated beverages (any kind)  Strained chicken noodle soup Hard Candy   Remember: Clear liquids are liquids that  will allow you to see your fingers on the other side of a clear glass. Be sure liquids are NOT red in color, and not cloudy, but CLEAR.  DO NOT EAT OR DRINK ANY OF THE FOLLOWING:  Dairy products of any kind   Cranberry juice Tomato juice / V8 juice   Grapefruit juice Orange juice     Red grape juice  Do not eat any solid foods, including such foods as: cereal, oatmeal, yogurt, fruits, vegetables, creamed soups, eggs, bread, crackers, pureed foods in a blender, etc.   HELPFUL HINTS FOR DRINKING PREP SOLUTION:   Make sure prep is extremely cold. Mix and refrigerate the the morning of the prep. You may also put in the freezer.   You may try mixing some Crystal Light or Country Time Lemonade if you prefer. Mix in small amounts; add more if necessary.  Try drinking through a straw  Rinse mouth with water or a mouthwash between glasses, to remove after-taste.  Try sipping on a cold beverage /ice/ popsicles between glasses of prep.  Place a piece of sugar-free hard candy in mouth between glasses.  If you become nauseated, try consuming smaller amounts, or stretch out the time between glasses. Stop for 30-60 minutes, then slowly start back drinking.     OTHER INSTRUCTIONS  You will need a responsible adult at  least 47 years of age to accompany you and drive you home. This person must remain in the waiting room during your procedure. The hospital will cancel your procedure if you do not have a responsible adult with you.   1. Wear loose fitting clothing that is easily removed. 2. Leave jewelry and other valuables at home.  3. Remove all body piercing jewelry and leave at home. 4. Total time from sign-in until discharge is approximately 2-3 hours. 5. You should go home directly after your procedure and rest. You can resume normal activities the day after your procedure. 6. The day of your procedure you should not:  Drive  Make legal decisions  Operate machinery  Drink alcohol  Return to work   You may call the office (Dept: [phone]) before 5:00pm, or page the doctor on call [phone]) after 5:00pm, for further instructions, if necessary.   Insurance Information YOU WILL NEED TO CHECK WITH YOUR INSURANCE COMPANY FOR THE BENEFITS OF COVERAGE YOU HAVE FOR THIS PROCEDURE.  UNFORTUNATELY, NOT ALL INSURANCE COMPANIES HAVE BENEFITS TO COVER ALL OR PART OF THESE TYPES OF PROCEDURES.  IT IS YOUR RESPONSIBILITY TO CHECK YOUR BENEFITS, HOWEVER, WE WILL BE GLAD TO ASSIST YOU WITH ANY CODES YOUR INSURANCE COMPANY MAY NEED.    PLEASE NOTE THAT MOST INSURANCE COMPANIES WILL NOT COVER A SCREENING COLONOSCOPY FOR PEOPLE UNDER THE AGE OF 50  IF YOU HAVE BCBS INSURANCE, YOU MAY HAVE BENEFITS FOR A SCREENING COLONOSCOPY BUT IF POLYPS ARE FOUND THE DIAGNOSIS WILL CHANGE AND THEN YOU MAY HAVE A DEDUCTIBLE THAT WILL NEED TO BE MET. SO PLEASE MAKE SURE YOU CHECK YOUR BENEFITS FOR A SCREENING COLONOSCOPY AS WELL AS A DIAGNOSTIC COLONOSCOPY.

## 2018-04-09 NOTE — Progress Notes (Addendum)
Gastroenterology Pre-Procedure Review  Request Date:04/09/18 Requesting Physician: Aggie Hacker PA- no previous tcs- +[facility]  PATIENT REVIEW QUESTIONS: The patient responded to the following health history questions as indicated:    1. Diabetes Melitis: no 2. Joint replacements in the past 12 months: no 3. Major health problems in the past 3 months: no 4. Has an artificial valve or MVP: no 5. Has a defibrillator: no 6. Has been advised in past to take antibiotics in advance of a procedure like teeth cleaning: no 7. Family history of colon cancer: yes (dad- age 36 ([place] pt- Joedy Cox Sr. 06/26/42))  8. Alcohol Use: no 9. History of sleep apnea: no  10. History of coronary artery or other vascular stents placed within the last 12 months: no 11. History of any prior anesthesia complications: no    MEDICATIONS & ALLERGIES:    Patient reports the following regarding taking any blood thinners:   Plavix? no Aspirin? no Coumadin? no Brilinta? no Xarelto? no Eliquis? no Pradaxa? no Savaysa? no Effient? no  Patient confirms/reports the following medications:  Current Outpatient Medications  Medication Sig Dispense Refill  . cetirizine (ZYRTEC) 10 MG tablet Take 10 mg by mouth daily.    Marland Kitchen ibuprofen (ADVIL,MOTRIN) 600 MG tablet Take 1 tablet (600 mg total) by mouth every 6 (six) hours as needed. 30 tablet 1  . omeprazole (PRILOSEC OTC) 20 MG tablet Take 20 mg by mouth daily.     No current facility-administered medications for this visit.     Patient confirms/reports the following allergies:  No Known Allergies  No orders of the defined types were placed in this encounter.   AUTHORIZATION INFORMATION Primary Insurance: [place],  [place] #: [id number] Pre-Cert / Josem Kaufmann required: no per Jonetta Osgood Pre-Cert / Auth #: [id number]  SCHEDULE INFORMATION: Procedure has been scheduled as follows:  Date: 06/08/18, Time: 9:30am Location: [facility]  This Gastroenterology Pre-Precedure  Review Form is being routed to the following provider(s): Roseanne Kaufman NP

## 2018-04-13 ENCOUNTER — Ambulatory Visit (INDEPENDENT_AMBULATORY_CARE_PROVIDER_SITE_OTHER): Payer: BLUE CROSS/BLUE SHIELD | Admitting: Pulmonary Disease

## 2018-04-13 ENCOUNTER — Encounter: Payer: Self-pay | Admitting: Pulmonary Disease

## 2018-04-13 VITALS — BP 140/82 | HR 86 | Ht 72.0 in | Wt 329.0 lb

## 2018-04-13 DIAGNOSIS — J301 Allergic rhinitis due to pollen: Secondary | ICD-10-CM

## 2018-04-13 DIAGNOSIS — R059 Cough, unspecified: Secondary | ICD-10-CM

## 2018-04-13 DIAGNOSIS — K219 Gastro-esophageal reflux disease without esophagitis: Secondary | ICD-10-CM

## 2018-04-13 DIAGNOSIS — R05 Cough: Secondary | ICD-10-CM

## 2018-04-13 LAB — PULMONARY FUNCTION TEST
DL/VA % PRED: 109 %
DL/VA: 5.22 ml/min/mmHg/L
DLCO unc % pred: 97 %
DLCO unc: 34.22 ml/min/mmHg
FEF 25-75 Post: 2.36 L/sec
FEF 25-75 Pre: 2.91 L/sec
FEF2575-%Change-Post: -18 %
FEF2575-%PRED-POST: 62 %
FEF2575-%Pred-Pre: 76 %
FEV1-%CHANGE-POST: -5 %
FEV1-%Pred-Post: 74 %
FEV1-%Pred-Pre: 78 %
FEV1-PRE: 3.38 L
FEV1-Post: 3.2 L
FEV1FVC-%Change-Post: -2 %
FEV1FVC-%Pred-Pre: 93 %
FEV6-%Change-Post: -3 %
FEV6-%PRED-POST: 83 %
FEV6-%Pred-Pre: 86 %
FEV6-PRE: 4.6 L
FEV6-Post: 4.43 L
FEV6FVC-%CHANGE-POST: 0 %
FEV6FVC-%PRED-PRE: 103 %
FEV6FVC-%Pred-Post: 103 %
FVC-%CHANGE-POST: -3 %
FVC-%PRED-PRE: 84 %
FVC-%Pred-Post: 81 %
FVC-POST: 4.46 L
FVC-Pre: 4.61 L
POST FEV1/FVC RATIO: 72 %
PRE FEV6/FVC RATIO: 100 %
Post FEV6/FVC ratio: 100 %
Pre FEV1/FVC ratio: 73 %
RV % PRED: 84 %
RV: 1.76 L
TLC % pred: 90 %
TLC: 6.65 L

## 2018-04-13 MED ORDER — AMITRIPTYLINE HCL 25 MG PO TABS: 25.0000 mg | ORAL_TABLET | Freq: Every day | ORAL | 1 refills | Status: DC

## 2018-04-13 NOTE — Progress Notes (Signed)
PFT completed today.  

## 2018-04-13 NOTE — Progress Notes (Signed)
Synopsis: Referred in October 2019 for chronic cough in the setting of acid reflux and allergic rhinitis.  A chest x-ray and lung function testing did not show evidence of lung disease.  Subjective:   PATIENT ID: Cory Christensen GENDER: male DOB: [birth year], MRN: [id number]   HPI  Chief Complaint  Patient presents with  . Follow-up    PFT today   Cory Christensen says that since the last visit his cough has improved significantly.  He says that it has improved 50 to 60% according to his wife and his coworkers.  Again, he tells me that it is not bothering him.  He does not have daytime sleepiness or snoring.  He is not coughing anything up.  He has not been sick since last visit.  Past Medical History:  Diagnosis Date  . GERD (gastroesophageal reflux disease)       Review of Systems  Constitutional: Negative for chills, fever, malaise/fatigue and weight loss.  HENT: Negative for congestion, nosebleeds, sinus pain and sore throat.   Eyes: Negative for photophobia, pain and discharge.  Respiratory: Negative for cough, hemoptysis, sputum production, shortness of breath and wheezing.   Cardiovascular: Negative for chest pain, palpitations, orthopnea and leg swelling.  Gastrointestinal: Positive for heartburn. Negative for abdominal pain, constipation, diarrhea, nausea and vomiting.  Genitourinary: Negative for dysuria, frequency, hematuria and urgency.  Musculoskeletal: Negative for back pain, joint pain, myalgias and neck pain.  Skin: Negative for itching and rash.  Neurological: Negative for tingling, tremors, sensory change, speech change, focal weakness, seizures, weakness and headaches.  Psychiatric/Behavioral: Negative for memory loss, substance abuse and suicidal ideas. The patient is not nervous/anxious.       Objective:  Physical Exam   Vitals:   04/13/18 1159  BP: 140/82  Pulse: 86  SpO2: 96%  Weight: (!) 329 lb (149.2 kg)  Height: 6' (1.829 m)    RA  Gen: obese  but well appearing HENT: OP clear, TM's clear, neck supple PULM: CTA B, normal percussion CV: RRR, no mgr, trace edema GI: BS+, soft, nontender Derm: no cyanosis or rash Psyche: normal mood and affect   CBC    Component Value Date/Time   WBC 10.3 04/05/2016 0719   RBC 5.25 04/05/2016 0719   HGB 15.3 04/05/2016 0719   HCT 44.7 04/05/2016 0719   PLT 274 04/05/2016 0719   MCV 85.1 04/05/2016 0719   MCH 29.1 04/05/2016 0719   MCHC 34.2 04/05/2016 0719   RDW 13.1 04/05/2016 0719   LYMPHSABS 1.9 04/05/2016 0719   MONOABS 0.8 04/05/2016 0719   EOSABS 0.2 04/05/2016 0719   BASOSABS 0.0 04/05/2016 0719     Chest imaging: February 2016 chest x-ray images independently reviewed shows normal pulmonary parenchyma without abnormality October 2019 chest x-ray images independently reviewed showing normal pulmonary parenchyma without abnormality  PFT: November 2019 ratio 73%, FEV1 3.4 L 78% predicted, total lung capacity 6.7 L 90% predicted, DLCO 34.2 mL 97% predicted  Labs:  Path:  Echo:  Heart Catheterization:  Records from his visit with his primary care physician reviewed from yesterday where he was seen for cough.  He was continued on Prilosec daily and was referred to [place].     Assessment & Plan:   Cough  Gastroesophageal reflux disease, esophagitis presence not specified  Allergic rhinitis due to pollen, unspecified seasonality  Discussion: [name] has improved significantly with the addition of nighttime antacid and with allergic rhinitis treatment.  Today we talked about the fact that with  another agent which can reduce his laryngeal sensitivity used in the short-term he may see some long-term improvement as it would allow his vocal cords to heal out from many years of chronic coughing.  We talked about the pros and cons of Elavil and the most common side effect being daytime sleepiness and he is willing to proceed.  I do not believe he has any evidence of underlying  lung disease.  Plan: Chronic cough: Continue taking Prilosec in the morning and Pepcid at night for your gastroesophageal reflux disease Continue taking cetirizine daily and fluticasone nose spray for the allergic rhinitis Take Elavil 25 mg at night, this should help with the cough If you notice anything like heart palpitations (it can very rarely cause a heart rhythm problem), excessive sleepiness call us and let us know Take the medicine for 8 weeks   I will plan on a follow-up visit with me in 2 months but if you are doing well you can cancel that visit    Current Outpatient Medications:  .  cetirizine (ZYRTEC) 10 MG tablet, Take 10 mg by mouth daily., Disp: , Rfl:  .  ibuprofen (ADVIL,MOTRIN) 600 MG tablet, Take 1 tablet (600 mg total) by mouth every 6 (six) hours as needed., Disp: 30 tablet, Rfl: 1 .  omeprazole (PRILOSEC OTC) 20 MG tablet, Take 20 mg by mouth daily., Disp: , Rfl:  .  Na Sulfate-K Sulfate-Mg Sulf (SUPREP BOWEL PREP KIT) 17.5-3.13-1.6 GM/177ML SOLN, Take 1 kit by mouth as directed. (Patient not taking: Reported on 04/13/2018), Disp: 1 Bottle, Rfl: 0

## 2018-04-13 NOTE — Patient Instructions (Signed)
Chronic cough: Continue taking Prilosec in the morning and Pepcid at night for your gastroesophageal reflux disease Continue taking cetirizine daily and fluticasone nose spray for the allergic rhinitis Take Elavil 25 mg at night, this should help with the cough If you notice anything like heart palpitations (it can very rarely cause a heart rhythm problem), excessive sleepiness call us and let us know Take the medicine for 8 weeks   I will plan on a follow-up visit with me in 2 months but if you are doing well you can cancel that visit

## 2018-04-16 NOTE — Progress Notes (Signed)
Appropriate.

## 2018-06-08 ENCOUNTER — Encounter (HOSPITAL_COMMUNITY)
Admission: RE | Disposition: A | Discharge status: Home / Self Care | Payer: Self-pay | Source: Home / Self Care | Attending: Gastroenterology

## 2018-06-08 ENCOUNTER — Other Ambulatory Visit: Payer: Self-pay

## 2018-06-08 ENCOUNTER — Encounter (HOSPITAL_COMMUNITY): Payer: Self-pay | Admitting: *Deleted

## 2018-06-08 ENCOUNTER — Ambulatory Visit (HOSPITAL_COMMUNITY)
Admission: RE | Admit: 2018-06-08 | Discharge: 2018-06-08 | Disposition: A | Discharge status: Home / Self Care | Payer: BLUE CROSS/BLUE SHIELD | Attending: Gastroenterology | Admitting: Gastroenterology

## 2018-06-08 DIAGNOSIS — K621 Rectal polyp: Secondary | ICD-10-CM | POA: Diagnosis not present

## 2018-06-08 DIAGNOSIS — K648 Other hemorrhoids: Secondary | ICD-10-CM | POA: Insufficient documentation

## 2018-06-08 DIAGNOSIS — D123 Benign neoplasm of transverse colon: Secondary | ICD-10-CM | POA: Diagnosis not present

## 2018-06-08 DIAGNOSIS — K219 Gastro-esophageal reflux disease without esophagitis: Secondary | ICD-10-CM | POA: Diagnosis not present

## 2018-06-08 DIAGNOSIS — K644 Residual hemorrhoidal skin tags: Secondary | ICD-10-CM | POA: Insufficient documentation

## 2018-06-08 DIAGNOSIS — Z79899 Other long term (current) drug therapy: Secondary | ICD-10-CM | POA: Insufficient documentation

## 2018-06-08 DIAGNOSIS — Q439 Congenital malformation of intestine, unspecified: Secondary | ICD-10-CM | POA: Insufficient documentation

## 2018-06-08 DIAGNOSIS — Z800 Family history of malignant neoplasm of digestive organs: Secondary | ICD-10-CM | POA: Diagnosis not present

## 2018-06-08 DIAGNOSIS — Z1211 Encounter for screening for malignant neoplasm of colon: Secondary | ICD-10-CM | POA: Diagnosis not present

## 2018-06-08 HISTORY — PX: COLONOSCOPY: SHX5424

## 2018-06-08 HISTORY — PX: POLYPECTOMY: SHX5525

## 2018-06-08 SURGERY — COLONOSCOPY
Anesthesia: Moderate Sedation

## 2018-06-08 MED ORDER — SODIUM CHLORIDE 0.9 % IV SOLN
INTRAVENOUS | Status: DC
  Administered 2018-06-08: 09:00:00 via INTRAVENOUS

## 2018-06-08 MED ORDER — STERILE WATER FOR IRRIGATION IR SOLN
Status: DC | PRN
  Administered 2018-06-08: 1.5 mL

## 2018-06-08 MED ORDER — MEPERIDINE HCL 100 MG/ML IJ SOLN
INTRAMUSCULAR | Status: DC | PRN
  Administered 2018-06-08 (×3): 25 mg

## 2018-06-08 MED ORDER — MIDAZOLAM HCL 5 MG/5ML IJ SOLN
INTRAMUSCULAR | Status: DC | PRN
  Administered 2018-06-08 (×3): 2 mg via INTRAVENOUS

## 2018-06-08 MED ORDER — MIDAZOLAM HCL 5 MG/5ML IJ SOLN
INTRAMUSCULAR | Status: AC
  Filled 2018-06-08: qty 10

## 2018-06-08 MED ORDER — MEPERIDINE HCL 100 MG/ML IJ SOLN
INTRAMUSCULAR | Status: AC
  Filled 2018-06-08: qty 2

## 2018-06-08 NOTE — Op Note (Signed)
[facility] Patient Name: Cory Christensen Procedure Date: 06/08/2018 9:22 AM MRN: [id number] Date of Birth: May 30, 1971 Attending MD: Barney Drain MD, MD CSN: [id number] Age: 47 Admit Type: Outpatient Procedure:                Colonoscopy, SCREENING Indications:              Screening in patient at increased risk: Colorectal                            cancer in father before age 33 Providers:                Barney Drain MD, MD, Janeece Riggers, RN, Gerome Sam, RN, Nelma Rothman, Technician Referring MD:             Rory Percy, MD Medicines:                Meperidine 75 mg IV, Midazolam 6 mg IV Complications:            No immediate complications. Estimated Blood Loss:     Estimated blood loss was minimal. Procedure:                Pre-Anesthesia Assessment:                           - Prior to the procedure, a History and Physical                            was performed, and patient medications and                            allergies were reviewed. The patient's tolerance of                            previous anesthesia was also reviewed. The risks                            and benefits of the procedure and the sedation                            options and risks were discussed with the patient.                            All questions were answered, and informed consent                            was obtained. Prior Anticoagulants: The patient has                            taken no previous anticoagulant or antiplatelet                            agents. ASA Grade Assessment: II - A patient with  mild systemic disease. After reviewing the risks                            and benefits, the patient was deemed in                            satisfactory condition to undergo the procedure.                            After obtaining informed consent, the colonoscope                            was passed under direct vision.  Throughout the                            procedure, the patient's blood pressure, pulse, and                            oxygen saturations were monitored continuously. The                            CF-HQ190L ([id number]) scope was introduced through                            the anus and advanced to the the cecum, identified                            by appendiceal orifice and ileocecal valve. The                            colonoscopy was somewhat difficult due to a                            tortuous colon. Successful completion of the                            procedure was aided by increasing the dose of                            sedation medication, straightening and shortening                            the scope to obtain bowel loop reduction and                            COLOWRAP. The patient tolerated the procedure well.                            The quality of the bowel preparation was good. The                            ileocecal valve, appendiceal orifice, and rectum  were photographed. Scope In: 10:02:59 AM Scope Out: 10:27:22 AM Scope Withdrawal Time: 0 hours 20 minutes 32 seconds  Total Procedure Duration: 0 hours 24 minutes 23 seconds  Findings:      Five sessile polyps were found in the rectum and transverse colon. The       polyps were 3 to 6 mm in size. These polyps were removed with a cold       snare. Resection and retrieval were complete.      The recto-sigmoid colon, sigmoid colon and descending colon revealed       moderately excessive looping.      External and internal hemorrhoids were found. The hemorrhoids were       moderate and small. Impression:               - Five 3 to 6 mm polyps in the rectum and in the                            transverse colon(4), removed with a cold snare.                            Resected and retrieved.                           - There was significant looping of the colon.                            - External and internal hemorrhoids. Moderate Sedation:      Moderate (conscious) sedation was administered by the endoscopy nurse       and supervised by the endoscopist. The following parameters were       monitored: oxygen saturation, heart rate, blood pressure, and response       to care. Total physician intraservice time was 41 minutes. Recommendation:           - Patient has a contact number available for                            emergencies. The signs and symptoms of potential                            delayed complications were discussed with the                            patient. Return to normal activities tomorrow.                            Written discharge instructions were provided to the                            patient.                           - High fiber diet.                           - Continue present medications.                           -  Await pathology results.                           - Repeat colonoscopy in 3 years for surveillance. Procedure Code(s):        --- Professional ---                           [phone], Colonoscopy, flexible; with removal of                            tumor(s), polyp(s), or other lesion(s) by snare                            technique                           99153, Moderate sedation; each additional 15                            minutes intraservice time                           99153, Moderate sedation; each additional 15                            minutes intraservice time                           G0500, Moderate sedation services provided by the                            same physician or other qualified health care                            professional performing a gastrointestinal                            endoscopic service that sedation supports,                            requiring the presence of an independent trained                            observer to assist in the monitoring of the                             patient's level of consciousness and physiological                            status; initial 15 minutes of intra-service time;                            patient age 53 years or older (additional time may                            be reported with  99153, as appropriate) Diagnosis Code(s):        --- Professional ---                           Z80.0, Family history of malignant neoplasm of                            digestive organs                           K62.1, Rectal polyp                           D12.3, Benign neoplasm of transverse colon (hepatic                            flexure or splenic flexure)                           K64.8, Other hemorrhoids CPT copyright 2018 [employer]. All rights reserved. The codes documented in this report are preliminary and upon coder review may  be revised to meet current compliance requirements. Barney Drain, MD Barney Drain MD, MD 06/08/2018 10:48:42 AM This report has been signed electronically. Number of Addenda: 0

## 2018-06-08 NOTE — H&P (Signed)
\ Primary Care Physician:  Rory Percy, MD Primary Gastroenterologist:  Dr. Oneida Alar  Pre-Procedure History & Physical: HPI:  Cory Christensen is a 47 y.o. male here for FAMILY Hx COLON CA-FATHER HAD COLON CA AGE < 60.  Past Medical History:  Diagnosis Date  . GERD (gastroesophageal reflux disease)     History reviewed. No pertinent surgical history.  Prior to Admission medications   Medication Sig Start Date End Date Taking? Authorizing Provider  cetirizine (ZYRTEC) 10 MG tablet Take 10 mg by mouth daily.   Yes [provider]  mometasone (NASONEX) 50 MCG/ACT nasal spray Place 2 sprays into the nose daily.   Yes [provider]  Na Sulfate-K Sulfate-Mg Sulf (SUPREP BOWEL PREP KIT) 17.5-3.13-1.6 GM/177ML SOLN Take 1 kit by mouth as directed. 04/09/18  Yes Annitta Needs, NP  omeprazole (PRILOSEC OTC) 20 MG tablet Take 20 mg by mouth daily.   Yes [provider]  amitriptyline (ELAVIL) 25 MG tablet Take 1 tablet (25 mg total) by mouth at bedtime. Patient not taking: Reported on 05/31/2018 04/13/18   Juanito Doom, MD    Allergies as of 04/09/2018  . (No Known Allergies)    History reviewed. No pertinent family history.  Social History   Socioeconomic History  . Marital status: Married    Spouse name: Not on file  . Number of children: Not on file  . Years of education: Not on file  . Highest education level: Not on file  Occupational History  . Not on file  Social Needs  . Financial resource strain: Not on file  . Food insecurity:    Worry: Not on file    Inability: Not on file  . Transportation needs:    Medical: Not on file    Non-medical: Not on file  Tobacco Use  . Smoking status: Never Smoker  . Smokeless tobacco: Never Used  Substance and Sexual Activity  . Alcohol use: No  . Drug use: Not on file  . Sexual activity: Not on file  Lifestyle  . Physical activity:    Days per week: Not on file    Minutes per session: Not on file   . Stress: Not on file  Relationships  . Social connections:    Talks on phone: Not on file    Gets together: Not on file    Attends religious service: Not on file    Active member of club or organization: Not on file    Attends meetings of clubs or organizations: Not on file    Relationship status: Not on file  . Intimate partner violence:    Fear of current or ex partner: Not on file    Emotionally abused: Not on file    Physically abused: Not on file    Forced sexual activity: Not on file  Other Topics Concern  . Not on file  Social History Narrative  . Not on file    Review of Systems: See HPI, otherwise negative ROS   Physical Exam: BP 135/89   Pulse 79   Temp 98.8 F (37.1 C) (Oral)   Resp 17   Ht _0  (1.88 m)   Wt (!) 140.6 kg   SpO2 99%   BMI 39.80 kg/m  General:   Alert,  pleasant and cooperative in NAD Head:  Normocephalic and atraumatic. Neck:  Supple; Lungs:  Clear throughout to auscultation.    Heart:  Regular rate and rhythm. Abdomen:  Soft, nontender and  nondistended. Normal bowel sounds, without guarding, and without rebound.   Neurologic:  Alert and  oriented x4;  grossly normal neurologically.  Impression/Plan:    FAMILY Hx COLON CA-FATHER HAD COLON CA AGE < 60.  Plan: 1. TCS TODAY. DISCUSSED PROCEDURE, BENEFITS, & RISKS: < 1% chance of medication reaction, bleeding, perforation, or rupture of spleen/liver.

## 2018-06-08 NOTE — Discharge Instructions (Signed)
You had FIVE polyps removed. You have SMALL internal hemorrhoids.   DRINK WATER TO KEEP YOUR URINE LIGHT YELLOW.  CONTINUE YOUR WEIGHT LOSS EFFORTS. YOUR BODY MASS INDEX IS OVER 30 WHICH MEANS YOU ARE OBESE. OBESITY IS ASSOCIATED WITH AN INCREASED FOR CIRRHOSIS AND ALL CANCERS, INCLUDING ESOPHAGEAL AND COLON CANCER. A WEIGHT OF 230 LBS OR LESS  WILL GET YOUR BODY MASS INDEX(BMI) UNDER 30.  FOLLOW A HIGH FIBER DIET. AVOID ITEMS THAT CAUSE BLOATING & GAS. SEE INFO BELOW.  YOUR BIOPSY RESULTS WILL BE BACK IN 5 BUSINESS DAYS.  Next colonoscopy in 3-5 years.    Colonoscopy Care After Read the instructions outlined below and refer to this sheet in the next week. These discharge instructions provide you with general information on caring for yourself after you leave the hospital. While your treatment has been planned according to the most current medical practices available, unavoidable complications occasionally occur. If you have any problems or questions after discharge, call DR. Mckinleigh Schuchart, [phone].  ACTIVITY  You may resume your regular activity, but move at a slower pace for the next 24 hours.   Take frequent rest periods for the next 24 hours.   Walking will help get rid of the air and reduce the bloated feeling in your belly (abdomen).   No driving for 24 hours (because of the medicine (anesthesia) used during the test).   You may shower.   Do not sign any important legal documents or operate any machinery for 24 hours (because of the anesthesia used during the test).    NUTRITION  Drink plenty of fluids.   You may resume your normal diet as instructed by your doctor.   Begin with a light meal and progress to your normal diet. Heavy or fried foods are harder to digest and may make you feel sick to your stomach (nauseated).   Avoid alcoholic beverages for 24 hours or as instructed.    MEDICATIONS  You may resume your normal medications.   WHAT YOU CAN EXPECT  TODAY  Some feelings of bloating in the abdomen.   Passage of more gas than usual.   Spotting of blood in your stool or on the toilet paper  .  IF YOU HAD POLYPS REMOVED DURING THE COLONOSCOPY:  Eat a soft diet IF YOU HAVE NAUSEA, BLOATING, ABDOMINAL PAIN, OR VOMITING.    FINDING OUT THE RESULTS OF YOUR TEST Not all test results are available during your visit. DR. Oneida Alar WILL CALL YOU WITHIN 14 DAYS OF YOUR PROCEDUE WITH YOUR RESULTS. Do not assume everything is normal if you have not heard from DR. Tequila Rottmann, CALL HER OFFICE AT [phone].  SEEK IMMEDIATE MEDICAL ATTENTION AND CALL THE OFFICE: [phone] IF:  You have more than a spotting of blood in your stool.   Your belly is swollen (abdominal distention).   You are nauseated or vomiting.   You have a temperature over 101F.   You have abdominal pain or discomfort that is severe or gets worse throughout the day.   High-Fiber Diet A high-fiber diet changes your normal diet to include more whole grains, legumes, fruits, and vegetables. Changes in the diet involve replacing refined carbohydrates with unrefined foods. The calorie level of the diet is essentially unchanged. The Dietary Reference Intake (recommended amount) for adult males is 38 grams per day. For adult females, it is 25 grams per day. Pregnant and lactating women should consume 28 grams of fiber per day. Fiber is the intact part of a  plant that is not broken down during digestion. Functional fiber is fiber that has been isolated from the plant to provide a beneficial effect in the body.  PURPOSE  Increase stool bulk.   Ease and regulate bowel movements.   Lower cholesterol.   REDUCE RISK OF COLON CANCER  INDICATIONS THAT YOU NEED MORE FIBER  Constipation and hemorrhoids.   Uncomplicated diverticulosis (intestine condition) and irritable bowel syndrome.   Weight management.   As a protective measure against hardening of the arteries  (atherosclerosis), diabetes, and cancer.   GUIDELINES FOR INCREASING FIBER IN THE DIET  Start adding fiber to the diet slowly. A gradual increase of about 5 more grams (2 slices of whole-wheat bread, 2 servings of most fruits or vegetables, or 1 bowl of high-fiber cereal) per day is best. Too rapid an increase in fiber may result in constipation, flatulence, and bloating.   Drink enough water and fluids to keep your urine clear or pale yellow. Water, juice, or caffeine-free drinks are recommended. Not drinking enough fluid may cause constipation.   Eat a variety of high-fiber foods rather than one type of fiber.   Try to increase your intake of fiber through using high-fiber foods rather than fiber pills or supplements that contain small amounts of fiber.   The goal is to change the types of food eaten. Do not supplement your present diet with high-fiber foods, but replace foods in your present diet.   INCLUDE A VARIETY OF FIBER SOURCES  Replace refined and processed grains with whole grains, canned fruits with fresh fruits, and incorporate other fiber sources. White rice, white breads, and most bakery goods contain little or no fiber.   Brown whole-grain rice, buckwheat oats, and many fruits and vegetables are all good sources of fiber. These include: broccoli, Brussels sprouts, cabbage, cauliflower, beets, sweet potatoes, white potatoes (skin on), carrots, tomatoes, eggplant, squash, berries, fresh fruits, and dried fruits.   Cereals appear to be the richest source of fiber. Cereal fiber is found in whole grains and bran. Bran is the fiber-rich outer coat of cereal grain, which is largely removed in refining. In whole-grain cereals, the bran remains. In breakfast cereals, the largest amount of fiber is found in those with "bran" in their names. The fiber content is sometimes indicated on the label.   You may need to include additional fruits and vegetables each day.   In baking, for 1 cup  white flour, you may use the following substitutions:   1 cup whole-wheat flour minus 2 tablespoons.   1/2 cup white flour plus 1/2 cup whole-wheat flour.   Polyps, Colon  A polyp is extra tissue that grows inside your body. Colon polyps grow in the large intestine. The large intestine, also called the colon, is part of your digestive system. It is a long, hollow tube at the end of your digestive tract where your body makes and stores stool. Most polyps are not dangerous. They are benign. This means they are not cancerous. But over time, some types of polyps can turn into cancer. Polyps that are smaller than a pea are usually not harmful. But larger polyps could someday become or may already be cancerous. To be safe, doctors remove all polyps and test them.   WHO GETS POLYPS? Anyone can get polyps, but certain people are more likely than others. You may have a greater chance of getting polyps if:  You are over 50.   You have had polyps before.   Someone  in your family has had polyps.   Someone in your family has had cancer of the large intestine.   Find out if someone in your family has had polyps. You may also be more likely to get polyps if you:   Eat a lot of fatty foods   Smoke   Drink alcohol   Do not exercise  Eat too much   PREVENTION There is not one sure way to prevent polyps. You might be able to lower your risk of getting them if you:  Eat more fruits and vegetables and less fatty food.   Do not smoke.   Avoid alcohol.   Exercise every day.   Lose weight if you are overweight.   Eating more calcium and folate can also lower your risk of getting polyps. Some foods that are rich in calcium are milk, cheese, and broccoli. Some foods that are rich in folate are chickpeas, kidney beans, and spinach.

## 2018-06-14 ENCOUNTER — Encounter (HOSPITAL_COMMUNITY): Payer: Self-pay | Admitting: Gastroenterology

## 2018-06-18 NOTE — Progress Notes (Signed)
Pt is aware.  

## 2018-06-20 DIAGNOSIS — M25562 Pain in left knee: Secondary | ICD-10-CM | POA: Diagnosis not present

## 2018-06-20 DIAGNOSIS — M25362 Other instability, left knee: Secondary | ICD-10-CM | POA: Diagnosis not present

## 2018-06-27 ENCOUNTER — Ambulatory Visit: Payer: BLUE CROSS/BLUE SHIELD | Admitting: Pulmonary Disease

## 2018-06-27 DIAGNOSIS — M25562 Pain in left knee: Secondary | ICD-10-CM | POA: Diagnosis not present

## 2018-06-27 NOTE — Progress Notes (Deleted)
   Synopsis: Referred in October 2019 for chronic cough in the setting of acid reflux and allergic rhinitis.  A chest x-ray and lung function testing did not show evidence of lung disease.  Subjective:   PATIENT ID: Cory Christensen GENDER: male DOB: 1970-08-08, MRN: [id number]   HPI  No chief complaint on file.  ***  Past Medical History:  Diagnosis Date  . GERD (gastroesophageal reflux disease)       Review of Systems  Constitutional: Negative for chills, fever, malaise/fatigue and weight loss.  HENT: Negative for congestion, nosebleeds, sinus pain and sore throat.   Eyes: Negative for photophobia, pain and discharge.  Respiratory: Negative for cough, hemoptysis, sputum production, shortness of breath and wheezing.   Cardiovascular: Negative for chest pain, palpitations, orthopnea and leg swelling.  Gastrointestinal: Positive for heartburn. Negative for abdominal pain, constipation, diarrhea, nausea and vomiting.  Genitourinary: Negative for dysuria, frequency, hematuria and urgency.  Musculoskeletal: Negative for back pain, joint pain, myalgias and neck pain.  Skin: Negative for itching and rash.  Neurological: Negative for tingling, tremors, sensory change, speech change, focal weakness, seizures, weakness and headaches.  Psychiatric/Behavioral: Negative for memory loss, substance abuse and suicidal ideas. The patient is not nervous/anxious.       Objective:  Physical Exam   There were no vitals filed for this visit.  RA  ***   CBC    Component Value Date/Time   WBC 10.3 04/05/2016 0719   RBC 5.25 04/05/2016 0719   HGB 15.3 04/05/2016 0719   HCT 44.7 04/05/2016 0719   PLT 274 04/05/2016 0719   MCV 85.1 04/05/2016 0719   MCH 29.1 04/05/2016 0719   MCHC 34.2 04/05/2016 0719   RDW 13.1 04/05/2016 0719   LYMPHSABS 1.9 04/05/2016 0719   MONOABS 0.8 04/05/2016 0719   EOSABS 0.2 04/05/2016 0719   BASOSABS 0.0 04/05/2016 0719     Chest imaging: February  2016 chest x-ray images independently reviewed shows normal pulmonary parenchyma without abnormality October 2019 chest x-ray images independently reviewed showing normal pulmonary parenchyma without abnormality  PFT: November 2019 ratio 73%, FEV1 3.4 L 78% predicted, total lung capacity 6.7 L 90% predicted, DLCO 34.2 mL 97% predicted  Labs:  Path:  Echo:  Heart Catheterization:  Records from his visit with his primary care physician reviewed from yesterday where he was seen for cough.  He was continued on Prilosec daily and was referred to [place].     Assessment & Plan:   No diagnosis found.  Discussion: ***   Current Outpatient Medications:  .  cetirizine (ZYRTEC) 10 MG tablet, Take 10 mg by mouth daily., Disp: , Rfl:  .  mometasone (NASONEX) 50 MCG/ACT nasal spray, Place 2 sprays into the nose daily., Disp: , Rfl:  .  omeprazole (PRILOSEC OTC) 20 MG tablet, Take 20 mg by mouth daily., Disp: , Rfl:

## 2018-07-04 DIAGNOSIS — M1712 Unilateral primary osteoarthritis, left knee: Secondary | ICD-10-CM | POA: Diagnosis not present

## 2018-07-04 DIAGNOSIS — M25562 Pain in left knee: Secondary | ICD-10-CM | POA: Diagnosis not present

## 2019-04-14 DIAGNOSIS — Z23 Encounter for immunization: Secondary | ICD-10-CM | POA: Diagnosis not present

## 2019-05-06 DIAGNOSIS — R5383 Other fatigue: Secondary | ICD-10-CM | POA: Diagnosis not present

## 2019-05-06 DIAGNOSIS — E039 Hypothyroidism, unspecified: Secondary | ICD-10-CM | POA: Diagnosis not present

## 2019-05-06 DIAGNOSIS — I10 Essential (primary) hypertension: Secondary | ICD-10-CM | POA: Diagnosis not present

## 2019-05-06 DIAGNOSIS — D649 Anemia, unspecified: Secondary | ICD-10-CM | POA: Diagnosis not present

## 2019-05-06 DIAGNOSIS — Z0001 Encounter for general adult medical examination with abnormal findings: Secondary | ICD-10-CM | POA: Diagnosis not present

## 2019-05-06 DIAGNOSIS — K219 Gastro-esophageal reflux disease without esophagitis: Secondary | ICD-10-CM | POA: Diagnosis not present

## 2019-05-06 DIAGNOSIS — E782 Mixed hyperlipidemia: Secondary | ICD-10-CM | POA: Diagnosis not present

## 2019-05-06 DIAGNOSIS — E559 Vitamin D deficiency, unspecified: Secondary | ICD-10-CM | POA: Diagnosis not present

## 2019-05-08 DIAGNOSIS — Z0001 Encounter for general adult medical examination with abnormal findings: Secondary | ICD-10-CM | POA: Diagnosis not present

## 2019-05-08 DIAGNOSIS — E559 Vitamin D deficiency, unspecified: Secondary | ICD-10-CM | POA: Diagnosis not present

## 2019-05-08 DIAGNOSIS — M7500 Adhesive capsulitis of unspecified shoulder: Secondary | ICD-10-CM | POA: Diagnosis not present

## 2019-05-08 DIAGNOSIS — E538 Deficiency of other specified B group vitamins: Secondary | ICD-10-CM | POA: Diagnosis not present

## 2019-05-15 DIAGNOSIS — M25511 Pain in right shoulder: Secondary | ICD-10-CM | POA: Diagnosis not present

## 2019-05-30 ENCOUNTER — Other Ambulatory Visit: Payer: Self-pay

## 2019-05-30 ENCOUNTER — Ambulatory Visit: Payer: BC Managed Care – PPO | Attending: Internal Medicine

## 2019-05-30 DIAGNOSIS — Z20828 Contact with and (suspected) exposure to other viral communicable diseases: Secondary | ICD-10-CM | POA: Diagnosis not present

## 2019-05-30 DIAGNOSIS — Z20822 Contact with and (suspected) exposure to covid-19: Secondary | ICD-10-CM

## 2019-05-31 LAB — NOVEL CORONAVIRUS, NAA: SARS-CoV-2, NAA: NOT DETECTED

## 2019-07-08 ENCOUNTER — Other Ambulatory Visit: Payer: BC Managed Care – PPO

## 2019-07-09 ENCOUNTER — Ambulatory Visit: Payer: BC Managed Care – PPO | Attending: Internal Medicine

## 2019-08-06 ENCOUNTER — Ambulatory Visit (INDEPENDENT_AMBULATORY_CARE_PROVIDER_SITE_OTHER): Payer: BC Managed Care – PPO | Admitting: Podiatry

## 2019-08-06 ENCOUNTER — Other Ambulatory Visit: Payer: Self-pay

## 2019-08-06 ENCOUNTER — Ambulatory Visit (INDEPENDENT_AMBULATORY_CARE_PROVIDER_SITE_OTHER): Payer: BC Managed Care – PPO

## 2019-08-06 ENCOUNTER — Other Ambulatory Visit: Payer: Self-pay | Admitting: Podiatry

## 2019-08-06 DIAGNOSIS — M79672 Pain in left foot: Secondary | ICD-10-CM

## 2019-08-06 DIAGNOSIS — M722 Plantar fascial fibromatosis: Secondary | ICD-10-CM | POA: Diagnosis not present

## 2019-08-07 ENCOUNTER — Encounter: Payer: Self-pay | Admitting: Podiatry

## 2019-08-07 NOTE — Progress Notes (Addendum)
  Subjective:  Patient ID: Cory Christensen, male    DOB: 21-Nov-1970,  MRN: [id number]  Chief Complaint  Patient presents with  . Foot Pain    pt is here for left heel pain of the left back of heel, pain has been going on for about a month. Pt puts pain as a 4 out of 10 on the pain scale    49 y.o. male presents with the above complaint.  Patient presents with left heel pain that has been going on for about a month and has progressively gotten worse.  Patient states the pain is more on the plantar lateral aspect of the heel.  Patient states it is very hard to walk on.  He states that it is shooting sensation and has progressively gotten worse.  His pain scale is 4 out of 10.  Patient has tried stretching it but not helped much.  He has not tried anything else over-the-counter.  He has not seen anyone else for this.   Review of Systems: Negative except as noted in the HPI. Denies N/V/F/Ch.  Past Medical History:  Diagnosis Date  . GERD (gastroesophageal reflux disease)     Current Outpatient Medications:  .  cetirizine (ZYRTEC) 10 MG tablet, Take 10 mg by mouth daily., Disp: , Rfl:  .  mometasone (NASONEX) 50 MCG/ACT nasal spray, Place 2 sprays into the nose daily., Disp: , Rfl:  .  omeprazole (PRILOSEC OTC) 20 MG tablet, Take 20 mg by mouth daily., Disp: , Rfl:   Social History   Tobacco Use  Smoking Status Never Smoker  Smokeless Tobacco Never Used    No Known Allergies Objective:  There were no vitals filed for this visit. There is no height or weight on file to calculate BMI. Constitutional Well developed. Well nourished.  Vascular Dorsalis pedis pulses palpable bilaterally. Posterior tibial pulses palpable bilaterally. Capillary refill normal to all digits.  No cyanosis or clubbing noted. Pedal hair growth normal.  Neurologic Normal speech. Oriented to person, place, and time. Epicritic sensation to light touch grossly present bilaterally.  Dermatologic Nails well  groomed and normal in appearance. No open wounds. No skin lesions.  Orthopedic: Normal joint ROM without pain or crepitus bilaterally. No visible deformities. Tender to palpation at the calcaneal tuber left. No pain with calcaneal squeeze left. Ankle ROM diminished range of motion left. Silfverskiold Test: positive left.   Radiographs: Taken and reviewed. No acute fractures or dislocations. No evidence of stress fracture.  Plantar heel spur present. Posterior heel spur present.   Assessment:   1. Foot pain, left   2. Plantar fasciitis of left foot    Plan:  Patient was evaluated and treated and all questions answered.  Plantar Fasciitis, left - XR reviewed as above.  - Educated on icing and stretching. Instructions given.  - Injection delivered to the plantar fascia as below. - DME: Plantar Fascial Brace - Pharmacologic management: None  Procedure: Injection Tendon/Ligament Location: Left plantar fascia at the glabrous junction; medial approach. Skin Prep: alcohol Injectate: 0.5 cc 0.5% marcaine plain, 0.5 cc of 1% Lidocaine, 0.5 cc kenalog 10. Disposition: Patient tolerated procedure well. Injection site dressed with a band-aid.  No follow-ups on file.

## 2019-09-03 ENCOUNTER — Other Ambulatory Visit: Payer: Self-pay

## 2019-09-03 ENCOUNTER — Ambulatory Visit (INDEPENDENT_AMBULATORY_CARE_PROVIDER_SITE_OTHER): Payer: BC Managed Care – PPO | Admitting: Podiatry

## 2019-09-03 DIAGNOSIS — M2142 Flat foot [pes planus] (acquired), left foot: Secondary | ICD-10-CM

## 2019-09-03 DIAGNOSIS — M2141 Flat foot [pes planus] (acquired), right foot: Secondary | ICD-10-CM | POA: Diagnosis not present

## 2019-09-03 DIAGNOSIS — M722 Plantar fascial fibromatosis: Secondary | ICD-10-CM | POA: Diagnosis not present

## 2019-09-03 DIAGNOSIS — M79672 Pain in left foot: Secondary | ICD-10-CM | POA: Diagnosis not present

## 2019-09-04 ENCOUNTER — Encounter: Payer: Self-pay | Admitting: Podiatry

## 2019-09-04 NOTE — Progress Notes (Signed)
  Subjective:  Patient ID: Cory Christensen, male    DOB: Nov 12, 1970,  MRN: [id number]  Chief Complaint  Patient presents with  . Plantar Fasciitis    pt is here for a f/u of plantar fasciitis of the left foot, pt states that he is feeling the same as he was last time, pt puts pain scale as a 5 out of 34    49 y.o. male presents with the above complaint.  Patient presents for follow-up with left heel pain.  Patient states is feeling the same as it was last time.  He states his pain did go down after the injection for a little bit.  That plantarflexion brace has not been helping.  The injection has not helped as much.  Pain scale is 5 out of 10.  He has been doing stretching exercises.  He denies any other acute complaints.   Review of Systems: Negative except as noted in the HPI. Denies N/V/F/Ch.  Past Medical History:  Diagnosis Date  . GERD (gastroesophageal reflux disease)     Current Outpatient Medications:  .  cetirizine (ZYRTEC) 10 MG tablet, Take 10 mg by mouth daily., Disp: , Rfl:  .  mometasone (NASONEX) 50 MCG/ACT nasal spray, Place 2 sprays into the nose daily., Disp: , Rfl:  .  omeprazole (PRILOSEC OTC) 20 MG tablet, Take 20 mg by mouth daily., Disp: , Rfl:   Social History   Tobacco Use  Smoking Status Never Smoker  Smokeless Tobacco Never Used    No Known Allergies Objective:  There were no vitals filed for this visit. There is no height or weight on file to calculate BMI. Constitutional Well developed. Well nourished.  Vascular Dorsalis pedis pulses palpable bilaterally. Posterior tibial pulses palpable bilaterally. Capillary refill normal to all digits.  No cyanosis or clubbing noted. Pedal hair growth normal.  Neurologic Normal speech. Oriented to person, place, and time. Epicritic sensation to light touch grossly present bilaterally.  Dermatologic Nails well groomed and normal in appearance. No open wounds. No skin lesions.  Orthopedic: Normal joint ROM  without pain or crepitus bilaterally. No visible deformities. Tender to palpation at the calcaneal tuber left. No pain with calcaneal squeeze left. Ankle ROM diminished range of motion left. Silfverskiold Test: positive left.   Radiographs: Taken and reviewed. No acute fractures or dislocations. No evidence of stress fracture.  Plantar heel spur present. Posterior heel spur present.   Assessment:   1. Pes planus of both feet   2. Plantar fasciitis of left foot   3. Foot pain, left    Plan:  Patient was evaluated and treated and all questions answered.  Plantar Fasciitis, left  - XR reviewed as above.  - Educated on icing and stretching. Instructions given.  -No further injection was given as patient is not getting any relief. - DME: Cam boot immobilization.  Cam boot was dispensed - Pharmacologic management: None  Semiflexible pes planus -I explained to the patient the etiology of pes planus deformity and various treatment options were discussed with the patient in extensive detail.  I believe patient will benefit from custom-made orthotics to help support the arch of the foot as well as control the hindfoot motion. -Patient will be scheduled to see [facility] for custom-made orthotics.    Return in about 4 weeks (around 10/01/2019), or see Dr. Posey Pronto, for See Liliane Channel for orthotics ASAP.

## 2019-09-18 ENCOUNTER — Ambulatory Visit (INDEPENDENT_AMBULATORY_CARE_PROVIDER_SITE_OTHER): Payer: BC Managed Care – PPO | Admitting: Orthotics

## 2019-09-18 ENCOUNTER — Other Ambulatory Visit: Payer: Self-pay

## 2019-09-18 DIAGNOSIS — M2142 Flat foot [pes planus] (acquired), left foot: Secondary | ICD-10-CM

## 2019-09-18 DIAGNOSIS — M2141 Flat foot [pes planus] (acquired), right foot: Secondary | ICD-10-CM

## 2019-09-18 DIAGNOSIS — M722 Plantar fascial fibromatosis: Secondary | ICD-10-CM

## 2019-09-18 NOTE — Progress Notes (Signed)

## 2019-10-01 ENCOUNTER — Other Ambulatory Visit: Payer: Self-pay

## 2019-10-01 ENCOUNTER — Ambulatory Visit (INDEPENDENT_AMBULATORY_CARE_PROVIDER_SITE_OTHER): Payer: BC Managed Care – PPO | Admitting: Podiatry

## 2019-10-01 ENCOUNTER — Encounter: Payer: Self-pay | Admitting: Podiatry

## 2019-10-01 DIAGNOSIS — M722 Plantar fascial fibromatosis: Secondary | ICD-10-CM

## 2019-10-01 DIAGNOSIS — M7732 Calcaneal spur, left foot: Secondary | ICD-10-CM | POA: Diagnosis not present

## 2019-10-01 DIAGNOSIS — M2141 Flat foot [pes planus] (acquired), right foot: Secondary | ICD-10-CM

## 2019-10-01 DIAGNOSIS — M2142 Flat foot [pes planus] (acquired), left foot: Secondary | ICD-10-CM

## 2019-10-01 MED ORDER — MELOXICAM 15 MG PO TABS: 15.0000 mg | ORAL_TABLET | Freq: Every day | ORAL | 0 refills | Status: DC

## 2019-10-01 NOTE — Progress Notes (Signed)
Subjective:  Patient ID: Cory Christensen, male    DOB: [birth year],  MRN: [id number]  Chief Complaint  Patient presents with  . Foot Pain    pt is here for a f/u on plantar fasciitis of the left foot, pt states that he feels a little better but states that he is still in pain.    49 y.o. male presents with the above complaint.  Patient presents for follow-up with left heel pain.  Patient states is feeling the same as it was last time.  He states his pain did go down after the injection for a little bit.  That plantarflexion brace has not been helping.  The injection has not helped as much.  Pain scale is 5 out of 10.  He has been doing stretching exercises.  He denies any other acute complaints.   Review of Systems: Negative except as noted in the HPI. Denies N/V/F/Ch.  Past Medical History:  Diagnosis Date  . GERD (gastroesophageal reflux disease)     Current Outpatient Medications:  .  cetirizine (ZYRTEC) 10 MG tablet, Take 10 mg by mouth daily., Disp: , Rfl:  .  mometasone (NASONEX) 50 MCG/ACT nasal spray, Place 2 sprays into the nose daily., Disp: , Rfl:  .  omeprazole (PRILOSEC OTC) 20 MG tablet, Take 20 mg by mouth daily., Disp: , Rfl:  .  meloxicam (MOBIC) 15 MG tablet, Take 1 tablet (15 mg total) by mouth daily., Disp: 30 tablet, Rfl: 0  Social History   Tobacco Use  Smoking Status Never Smoker  Smokeless Tobacco Never Used    No Known Allergies Objective:  There were no vitals filed for this visit. There is no height or weight on file to calculate BMI. Constitutional Well developed. Well nourished.  Vascular Dorsalis pedis pulses palpable bilaterally. Posterior tibial pulses palpable bilaterally. Capillary refill normal to all digits.  No cyanosis or clubbing noted. Pedal hair growth normal.  Neurologic Normal speech. Oriented to person, place, and time. Epicritic sensation to light touch grossly present bilaterally.  Dermatologic Nails well groomed and normal  in appearance. No open wounds. No skin lesions.  Orthopedic: Normal joint ROM without pain or crepitus bilaterally. No visible deformities. Tender to palpation at the calcaneal tuber left. No pain with calcaneal squeeze left. Ankle ROM diminished range of motion left. Silfverskiold Test: positive left.   Radiographs: Taken and reviewed. No acute fractures or dislocations. No evidence of stress fracture.  Plantar heel spur present. Posterior heel spur present.   Assessment:   1. Heel spur, left   2. Plantar fasciitis of left foot   3. Pes planus of both feet    Plan:  Patient was evaluated and treated and all questions answered.  Plantar Fasciitis, left  - XR reviewed as above.  - Educated on icing and stretching. Instructions given.  -No further injection was given as patient is not getting any relief. - DME: Continue cam boot immobilization - Pharmacologic management: Mobic -I believe he will benefit from another steroid injection as his pain is very minimal but still there.  I also discussed with him surgical management as well however I would like the patient to try another injection as well as Mobic as well as orthotics prior to undergoing surgical intervention.  Patient agrees with the plan would like to proceed with conservative therapy and will do further.  Semiflexible pes planus -I explained to the patient the etiology of pes planus deformity and various treatment options were discussed with  the patient in extensive detail.  I believe patient will benefit from custom-made orthotics to help support the arch of the foot as well as control the hindfoot motion. -Patient will be obtaining orthotics tomorrow.  I have asked the patient to attempt break-in period prior to wearing them at full times.  Patient states understanding will attempt to do that.     No follow-ups on file.

## 2019-10-02 ENCOUNTER — Encounter: Payer: BC Managed Care – PPO | Admitting: Orthotics

## 2019-11-05 ENCOUNTER — Ambulatory Visit: Payer: BC Managed Care – PPO | Admitting: Podiatry

## 2020-03-12 DIAGNOSIS — M19011 Primary osteoarthritis, right shoulder: Secondary | ICD-10-CM | POA: Diagnosis not present

## 2020-03-12 DIAGNOSIS — M25511 Pain in right shoulder: Secondary | ICD-10-CM | POA: Insufficient documentation

## 2020-04-09 ENCOUNTER — Emergency Department: Payer: BC Managed Care – PPO

## 2020-04-09 ENCOUNTER — Emergency Department
Admission: EM | Admit: 2020-04-09 | Discharge: 2020-04-09 | Disposition: A | Discharge status: Home / Self Care | Payer: BC Managed Care – PPO | Attending: Emergency Medicine | Admitting: Emergency Medicine

## 2020-04-09 ENCOUNTER — Other Ambulatory Visit: Payer: Self-pay

## 2020-04-09 DIAGNOSIS — R0789 Other chest pain: Secondary | ICD-10-CM | POA: Diagnosis not present

## 2020-04-09 DIAGNOSIS — R079 Chest pain, unspecified: Secondary | ICD-10-CM | POA: Diagnosis not present

## 2020-04-09 DIAGNOSIS — I10 Essential (primary) hypertension: Secondary | ICD-10-CM | POA: Diagnosis not present

## 2020-04-09 LAB — CBC
HCT: 44.1 % (ref 39.0–52.0)
Hemoglobin: 14.9 g/dL (ref 13.0–17.0)
MCH: 28.4 pg (ref 26.0–34.0)
MCHC: 33.8 g/dL (ref 30.0–36.0)
MCV: 84.2 fL (ref 80.0–100.0)
Platelets: 251 10*3/uL (ref 150–400)
RBC: 5.24 MIL/uL (ref 4.22–5.81)
RDW: 12.8 % (ref 11.5–15.5)
WBC: 8.9 10*3/uL (ref 4.0–10.5)
nRBC: 0 % (ref 0.0–0.2)

## 2020-04-09 LAB — TROPONIN I (HIGH SENSITIVITY)
Troponin I (High Sensitivity): 3 ng/L (ref <18)
Troponin I (High Sensitivity): 3 ng/L (ref <18)

## 2020-04-09 LAB — BASIC METABOLIC PANEL
Anion gap: 11 (ref 5–15)
BUN: 18 mg/dL (ref 6–20)
CO2: 24 mmol/L (ref 22–32)
Calcium: 9.2 mg/dL (ref 8.9–10.3)
Chloride: 104 mmol/L (ref 98–111)
Creatinine, Ser: 0.92 mg/dL (ref 0.61–1.24)
GFR, Estimated: >60 mL/min (ref >60)
Glucose, Bld: 111 mg/dL — ABNORMAL HIGH (ref 70–99)
Potassium: 3.8 mmol/L (ref 3.5–5.1)
Sodium: 139 mmol/L (ref 135–145)

## 2020-04-09 NOTE — ED Provider Notes (Signed)
[facility] Emergency Department Provider Note   ____________________________________________    I have reviewed the triage vital signs and the nursing notes.   HISTORY  Chief Complaint Chest Pain     HPI Cory Christensen is a 49 y.o. male who presents with complaints of chest pain. Patient reports history of elevated cholesterol, strong family history of heart disease. He notes this morning when he woke up he had pressure in his central chest. It did not worsen with exertion but was constant until he was in the waiting room for about an hour. He notes that he is feeling better now. No radiation of the discomfort. No calf pain or swelling. No history of blood clots. No history of heart disease. No fevers chills or cough. No nausea or vomiting, did not take anything for this  Past Medical History:  Diagnosis Date  . GERD (gastroesophageal reflux disease)     Patient Active Problem List   Diagnosis Date Noted  . Special screening for malignant neoplasms, colon     Past Surgical History:  Procedure Laterality Date  . COLONOSCOPY N/A 06/08/2018   Procedure: COLONOSCOPY;  Surgeon: Danie Binder, MD;  Location: [facility];  Service: Endoscopy;  Laterality: N/A;  9:30  . POLYPECTOMY  06/08/2018   Procedure: POLYPECTOMY;  Surgeon: Danie Binder, MD;  Location: [facility];  Service: Endoscopy;;  transverse colon,rectum    Prior to Admission medications   Medication Sig Start Date End Date Taking? Authorizing Provider  cetirizine (ZYRTEC) 10 MG tablet Take 10 mg by mouth daily.    [provider]  meloxicam (MOBIC) 15 MG tablet Take 1 tablet (15 mg total) by mouth daily. 10/01/19   Felipa Furnace, DPM  mometasone (NASONEX) 50 MCG/ACT nasal spray Place 2 sprays into the nose daily.    [provider]  omeprazole (PRILOSEC OTC) 20 MG tablet Take 20 mg by mouth daily.    [provider]     Allergies Patient has no  known allergies.  Family History  Problem Relation Age of Onset  . Colon cancer Father        AGE < 45  . Crohn's disease Neg Hx     Social History Social History   Tobacco Use  . Smoking status: Never Smoker  . Smokeless tobacco: Never Used  Substance Use Topics  . Alcohol use: No  . Drug use: Not on file    Review of Systems  Constitutional: No fever/chills Eyes: No visual changes.  ENT: No sore throat. Cardiovascular: As above Respiratory: Denies shortness of breath. Gastrointestinal: No abdominal pain.  No nausea, no vomiting.   Genitourinary: Negative for dysuria. Musculoskeletal: Negative for back pain. Skin: Negative for rash. Neurological: Negative for headaches    ____________________________________________   PHYSICAL EXAM:  VITAL SIGNS: ED Triage Vitals  Enc Vitals Group     BP 04/09/20 0716 (!) 157/89     Pulse Rate 04/09/20 0716 64     Resp 04/09/20 0716 17     Temp 04/09/20 0716 98.7 F (37.1 C)     Temp Source 04/09/20 0716 Oral     SpO2 04/09/20 0716 99 %     Weight --      Height --      Head Circumference --      Peak Flow --      Pain Score 04/09/20 0712 2     Pain Loc --  Pain Edu? --      Excl. in [place]? --     Constitutional: Alert and oriented.   Nose: No congestion/rhinnorhea. Mouth/Throat: Mucous membranes are moist.    Cardiovascular: Normal rate, regular rhythm. Grossly normal heart sounds.  Good peripheral circulation. Respiratory: Normal respiratory effort.  No retractions. Lungs CTAB. Gastrointestinal: Soft and nontender. No distention.  No CVA tenderness.  Musculoskeletal: No lower extremity tenderness nor edema.  Warm and well perfused Neurologic:  Normal speech and language. No gross focal neurologic deficits are appreciated.  Skin:  Skin is warm, dry and intact. No rash noted. Psychiatric: Mood and affect are normal. Speech and behavior are normal.  ____________________________________________   LABS (all  labs ordered are listed, but only abnormal results are displayed)  Labs Reviewed  BASIC METABOLIC PANEL - Abnormal; Notable for the following components:      Result Value   Glucose, Bld 111 (*)    All other components within normal limits  CBC  TROPONIN I (HIGH SENSITIVITY)  TROPONIN I (HIGH SENSITIVITY)   ____________________________________________  EKG  ED ECG REPORT I, Lavonia Drafts, the attending physician, personally viewed and interpreted this ECG.  Date: 04/09/2020  Rhythm: normal sinus rhythm QRS Axis: normal Intervals: normal ST/T Wave abnormalities: normal Narrative Interpretation: no evidence of acute ischemia  ____________________________________________  RADIOLOGY  Chest x-ray viewed by me, no infiltrate or effusion ____________________________________________   PROCEDURES  Procedure(s) performed: yes  .1-3 Lead EKG Interpretation Performed by: Lavonia Drafts, MD Authorized by: Lavonia Drafts, MD     Interpretation: normal     ECG rate assessment: normal     Rhythm: sinus rhythm     Ectopy: none     Conduction: normal       Critical Care performed: No ____________________________________________   INITIAL IMPRESSION / ASSESSMENT AND PLAN / ED COURSE  Pertinent labs & imaging results that were available during my care of the patient were reviewed by me and considered in my medical decision making (see chart for details).  Patient presents with chest pain as described above. Is now asymptomatic. Differential includes angina, ACS, GERD. Not consistent with dissection or PE based on history  Patient does have strong family history of CAD but EKG is reassuring. Delta troponin normal.  Chest x-ray reviewed by me, no infiltrate or effusion. Patient has been vaccinated against COVID-19.  Lab work reviewed, normal CBC, delta troponin normal. BNP normal.  Given reassuring work-up and asymptomatic here in the emergency department appropriate for  discharge with very close cardiology follow-up, strict return precautions. Counseled patient no exertion till cleared by cardiology.    ____________________________________________   FINAL CLINICAL IMPRESSION(S) / ED DIAGNOSES  Final diagnoses:  Atypical chest pain        Note:  This document was prepared using Dragon voice recognition software and may include unintentional dictation errors.   Lavonia Drafts, MD 04/09/20 1401

## 2020-04-09 NOTE — ED Triage Notes (Signed)
Reports chest tightness starting at 4AM today. Denies SOB, NV. No hx of chest pain. Pt alert and oriented X4, cooperative, RR even and unlabored, color WNL. Pt in NAD.

## 2020-04-16 NOTE — Progress Notes (Signed)
New Outpatient Visit Date: 04/17/2020  Referring Provider: Lavonia Drafts, MD [facility] Emergency Department  Chief Complaint: Chest pain  HPI:  Mr. Cory Christensen is a 49 y.o. male who is being seen today for the evaluation of chest pain at the request of Dr. Corky Downs. He has a history of GERD, elevated cholesterol, and family history of heart disease.  He presented to the [facility] Emergency Department on 04/09/2020 complaining of persistent chest pressure not worsened by exertion when he woke up on the day of ED visit.  EKG showed poor R wave progression in the precordial leads.  HS-TnI was negative x 2.  Mr. Cory Christensen reports that he was feeling vague chest tightness the night before his ED visit when walking up stairs.  He went to bed feeling fine but awoke around 4:30 AM with moderate central chest pressure.  He describes it as "normal handshake pressure" under his sternum.  It resolved spontaneously while waiting in the ED 4-5 hours later.  He noted an episode of sudden bilious emesis shortly before going to the ED, though he did not experience significant nausea beforehand.  The chest pain has not recurred, though he has felt a little short of breath since then.  He also notes that his blood pressure has been running high.  Mr. Cory Christensen denies a history of heart disease and prior cardiac testing.  He has not experienced palpitations, lightheadedness, orthopnea, PND, or edema.  He has chronic GERD, which he controls with omeprazole.  He had not missed any omeprazole recently.  On further questioning, he reports a cough that began around the time of his chest pain, which continues with intermittent scant yellowish sputum production.  He has not had an fevers or chills.  --------------------------------------------------------------------------------------------------  Cardiovascular History & Procedures: Cardiovascular Problems:  Chest pain  Risk Factors:  Hyperlipidemia, male gender, obesity, and family  history  Cath/PCI:  None  CV Surgery:  None  EP Procedures and Devices:  None  Non-Invasive Evaluation(s):  None  Recent CV Pertinent Labs: Lab Results  Component Value Date   K 3.8 04/09/2020   BUN 18 04/09/2020   CREATININE 0.92 04/09/2020    --------------------------------------------------------------------------------------------------  Past Medical History:  Diagnosis Date  . GERD (gastroesophageal reflux disease)     Past Surgical History:  Procedure Laterality Date  . COLONOSCOPY N/A 06/08/2018   Procedure: COLONOSCOPY;  Surgeon: Danie Binder, MD;  Location: [facility];  Service: Endoscopy;  Laterality: N/A;  9:30  . POLYPECTOMY  06/08/2018   Procedure: POLYPECTOMY;  Surgeon: Danie Binder, MD;  Location: [facility];  Service: Endoscopy;;  transverse colon,rectum    Current Meds  Medication Sig  . cetirizine (ZYRTEC) 10 MG tablet Take 10 mg by mouth daily.  . mometasone (NASONEX) 50 MCG/ACT nasal spray Place 2 sprays into the nose daily.  Marland Kitchen omeprazole (PRILOSEC OTC) 20 MG tablet Take 20 mg by mouth daily.    Allergies: Patient has no known allergies.  Social History   Tobacco Use  . Smoking status: Never Smoker  . Smokeless tobacco: Never Used  Substance Use Topics  . Alcohol use: No  . Drug use: Not on file    Family History  Problem Relation Age of Onset  . Colon cancer Father        AGE < 24  . Crohn's disease Neg Hx     Review of Systems: Mr. Cory Christensen reports right shoulder pain and anticipates needing surgery for this in early 2022.  Otherwise, a 12-system review of systems was performed and was negative except as noted in the HPI.  --------------------------------------------------------------------------------------------------  Physical Exam: BP (!) 148/88 (BP Location: Left Arm, Patient Position: Sitting, Cuff Size: Large)   Pulse 73   Ht 6\' 3"  (1.905 m)   Wt (!) 326 lb (147.9 kg)   SpO2 97%   BMI 40.75 kg/m    General:  NAD. HEENT: No conjunctival pallor or scleral icterus. Facemask in place. Neck: Supple without lymphadenopathy, thyromegaly, JVD, or HJR. No carotid bruit. Lungs: Normal work of breathing. Clear to auscultation bilaterally without wheezes or crackles. Heart: Regular rate and rhythm without murmurs, rubs, or gallops. Unable to assess PMI due to body habitus. Abd: Bowel sounds present. Soft, NT/ND.  Unable to assess HSM due to body habitus. Ext: Trace pretibial edema bilaterally. Radial, PT, and DP pulses are 2+ bilaterally Skin: Warm and dry without rash. Neuro: CNIII-XII intact. Strength and fine-touch sensation intact in upper and lower extremities bilaterally. Psych: Normal mood and affect.  EKG:  Normal sinus rhythm without abnormality.  Lab Results  Component Value Date   WBC 8.9 04/09/2020   HGB 14.9 04/09/2020   HCT 44.1 04/09/2020   MCV 84.2 04/09/2020   PLT 251 04/09/2020    Lab Results  Component Value Date   NA 139 04/09/2020   K 3.8 04/09/2020   CL 104 04/09/2020   CO2 24 04/09/2020   BUN 18 04/09/2020   CREATININE 0.92 04/09/2020   GLUCOSE 111 (H) 04/09/2020   ALT 28 04/05/2016    No results found for: CHOL, HDL, LDLCALC, LDLDIRECT, TRIG, CHOLHDL   --------------------------------------------------------------------------------------------------  ASSESSMENT AND PLAN: Atypical chest pain: Mr. Cory Christensen reports a single episode of chest pain that has both typical and atypical components.  He has not had a recurrence since his ED visit last week, though he notes some continued exertional dyspnea.  ED evaluation was unremarkable.  Physical exam and EKG are normal today.  We have agreed to perform an exercise tolerance test.  In the meantime, I have recommended that he begin taking aspirin 81 mg daily pending the aforementioned stress test.  Elevated blood pressure: Mr. Cory Christensen does not carry a diagnosis of hypertension but notes that his blood pressure has been  elevated since his ED visit last week.  We have discussed the importance of sodium restriction and other lifestyle modifications.  We we will defer adding medications at this time, though pharmacotherapy will need to be considered if his BP remains elevated at our follow-up visit.  Morbid obesity: BMI > 40.  Weight loss encouraged through diet and exercise.  Follow-up: Return to clinic in 1 month.  Nelva Bush, MD 04/17/2020 8:22 AM

## 2020-04-17 ENCOUNTER — Other Ambulatory Visit: Payer: Self-pay

## 2020-04-17 ENCOUNTER — Ambulatory Visit (INDEPENDENT_AMBULATORY_CARE_PROVIDER_SITE_OTHER): Payer: BC Managed Care – PPO | Admitting: Internal Medicine

## 2020-04-17 ENCOUNTER — Encounter: Payer: Self-pay | Admitting: Internal Medicine

## 2020-04-17 VITALS — BP 148/88 | HR 73 | Ht 75.0 in | Wt 326.0 lb

## 2020-04-17 DIAGNOSIS — R079 Chest pain, unspecified: Secondary | ICD-10-CM | POA: Diagnosis not present

## 2020-04-17 DIAGNOSIS — R030 Elevated blood-pressure reading, without diagnosis of hypertension: Secondary | ICD-10-CM

## 2020-04-17 MED ORDER — ASPIRIN EC 81 MG PO TBEC: 81.0000 mg | DELAYED_RELEASE_TABLET | Freq: Every day | ORAL | 3 refills | Status: DC

## 2020-04-17 NOTE — Patient Instructions (Signed)
Medication Instructions:  Your physician has recommended you make the following change in your medication:  1- START Aspirin 81 mg by mouth once a day.  *If you need a refill on your cardiac medications before your next appointment, please call your pharmacy*   Lab Work: COVID PRE- TEST: You will need a COVID TEST prior to the procedure:  LOCATION: [facility].  DATE/TIME:  __the day before the stress test____ anytime between 8 am and 1 pm.  If you have labs (blood work) drawn today and your tests are completely normal, you will receive your results only by: Marland Kitchen MyChart Message (if you have MyChart) OR . A paper copy in the mail If you have any lab test that is abnormal or we need to change your treatment, we will call you to review the results.   Testing/Procedures: Your physician has requested that you have an exercise tolerance test.    DO NOT drink or eat foods with caffeine for 24 hours before the test. (Chocolate, coffee, tea, decaf coffee/tea, or energy drinks)  DO NOT smoke for 4 hours before your test.  If you use an inhaler, bring it with you to the test.  Wear comfortable shoes and clothing.    Follow-Up: At [facility], you and your health needs are our priority.  As part of our continuing mission to provide you with exceptional heart care, we have created designated Provider Care Teams.  These Care Teams include your primary Cardiologist (physician) and Advanced Practice Providers (APPs -  Physician Assistants and Nurse Practitioners) who all work together to provide you with the care you need, when you need it.  We recommend signing up for the patient portal called "MyChart".  Sign up information is provided on this After Visit Summary.  MyChart is used to connect with patients for Virtual Visits (Telemedicine).  Patients are able to view lab/test results, encounter notes, upcoming appointments, etc.  Non-urgent messages can be  sent to your provider as well.   To learn more about what you can do with MyChart, go to [email or web address].    Your next appointment:   1 month(s)  The format for your next appointment:   In Person  Provider:   You may see DR Harrell Gave END or one of the following Advanced Practice Providers on your designated Care Team:    Murray Hodgkins, NP  Christell Faith, PA-C  Marrianne Mood, PA-C  Cadence Kathlen Mody, [place]    Exercise Stress Test An exercise stress test is a test to check how your heart works during exercise. You will need to walk on a treadmill or ride an exercise bike for this test. An electrocardiogram (ECG) will record your heartbeat when you are at rest and when you are exercising. You may have an ultrasound or nuclear test after the exercise test. The test is done to check for coronary artery disease (CAD). It is also done to:  See how well you can exercise.  Watch for high blood pressure during exercise.  Test how well you can exercise after treatment.  Check the blood flow to your arms and legs. If your test result is not normal, more testing may be needed. What happens before the procedure?  Follow instructions from your doctor about what you cannot eat or drink. ? Do not have any drinks or foods that have caffeine in them for 24 hours before the test, or as told by your doctor. This includes coffee, tea (even  decaf tea), sodas, chocolate, and cocoa.  Ask your doctor about changing or stopping your normal medicines. This is important if you: ? Take diabetes medicines. ? Take beta-blocker medicines. ? Wear a nitroglycerin patch.  If you use an inhaler, bring it with you to the test.  Do not put lotions, powders, creams, or oils on your chest before the test.  Wear comfortable shoes and clothing.  Do not use any products that have nicotine or tobacco in them, such as cigarettes and e-cigarettes. Stop using them at least 4 hours before the test. If you  need help quitting, ask your doctor. What happens during the procedure?   Patches (electrodes) will be put on your chest.  Wires will be connected to the patches. The wires will send signals to a machine to record your heartbeat.  Your heart rate will be watched while you are resting and while you are exercising. Your blood pressure will also be watched during the test.  You will walk on a treadmill or use a stationary bike. If you cannot use these, you may be asked to turn a crank with your hands.  The activity will get harder and will raise your heart rate.  You may be asked to breathe into a tube a few times during the test. This measures the gases that you breathe out.  You will be asked how you are feeling throughout the test.  You will exercise until your heart reaches a target heart rate. You will stop early if: ? You feel dizzy. ? You have chest pain. ? You are out of breath. ? Your blood pressure is too high or too low. ? You have an irregular heartbeat. ? You have pain or aching in your arms or legs. The procedure may vary among doctors and hospitals. What happens after the procedure?  Your blood pressure, heart rate, breathing rate, and blood oxygen level will be watched after the test.  You may return to your normal diet and activities as told by your doctor.  It is up to you to get the results of your test. Ask your doctor, or the department that is doing the test, when your results will be ready. Summary  An exercise stress test is a test to check how your heart works during exercise.  This test is done to check for coronary artery disease.  Your heart rate will be watched while you are resting and while you are exercising.  Follow instructions from your doctor about what you cannot eat or drink before the test. This information is not intended to replace advice given to you by your health care provider. Make sure you discuss any questions you have with your  health care provider. Document Revised: 09/11/2018 Document Reviewed: 08/30/2016 Elsevier Patient Education  [place].

## 2020-04-21 NOTE — Addendum Note (Signed)
Addended by: Ronaldo Miyamoto on: 04/21/2020 08:17 AM   Modules accepted: Orders

## 2020-05-12 ENCOUNTER — Telehealth: Payer: Self-pay | Admitting: Internal Medicine

## 2020-05-12 NOTE — Telephone Encounter (Signed)
Patient would like to wait to schedule ETT in Jan Awaiting nurse schedule to reschedule

## 2020-05-14 ENCOUNTER — Other Ambulatory Visit: Admission: RE | Admit: 2020-05-14 | Payer: BC Managed Care – PPO | Source: Ambulatory Visit

## 2020-05-29 ENCOUNTER — Ambulatory Visit: Payer: BC Managed Care – PPO | Admitting: Nurse Practitioner

## 2020-07-08 ENCOUNTER — Other Ambulatory Visit: Admission: RE | Admit: 2020-07-08 | Payer: BC Managed Care – PPO | Source: Ambulatory Visit

## 2020-07-22 ENCOUNTER — Ambulatory Visit: Payer: BC Managed Care – PPO | Admitting: Nurse Practitioner

## 2020-10-13 ENCOUNTER — Other Ambulatory Visit: Payer: Self-pay | Admitting: Orthopedic Surgery

## 2020-10-13 DIAGNOSIS — M19011 Primary osteoarthritis, right shoulder: Secondary | ICD-10-CM

## 2020-10-21 ENCOUNTER — Ambulatory Visit
Admission: RE | Admit: 2020-10-21 | Discharge: 2020-10-21 | Disposition: A | Discharge status: Home / Self Care | Payer: BC Managed Care – PPO | Source: Ambulatory Visit | Attending: Orthopedic Surgery | Admitting: Orthopedic Surgery

## 2020-10-21 DIAGNOSIS — M19011 Primary osteoarthritis, right shoulder: Secondary | ICD-10-CM

## 2020-12-09 NOTE — Pre-Procedure Instructions (Addendum)
Surgical Instructions    Your procedure is scheduled on Tuesday July 5th.  Report to [facility] Main Entrance "A" at 10:30 A.M., then check in with the Admitting office.  Call this number if you have problems the morning of surgery:  [phone]   If you have any questions prior to your surgery date call [phone]: Open Monday-Friday 8am-4pm    Remember:  Do not eat after midnight the night before your surgery  You may drink clear liquids until 09:30 A.M. the morning of your surgery.   Clear liquids allowed are: Water, Non-Citrus Juices (without pulp), Carbonated Beverages, Clear Tea, Black Coffee Only, and Gatorade Patient Instructions  The night before surgery:  No food after midnight. ONLY clear liquids after midnight  The day of surgery (if you do NOT have diabetes):  Drink ONE (1) Pre-Surgery Clear Ensure by 09:30 A.M. the morning of surgery. Drink in one sitting. Do not sip.  This drink was given to you during your hospital  pre-op appointment visit.  Nothing else to drink after completing the  Pre-Surgery Clear Ensure.         If you have questions, please contact your surgeon's office.     Take these medicines the morning of surgery with A SIP OF WATER   cetirizine (ZYRTEC)- If needed  mometasone (NASONEX)- If needed  omeprazole (PRILOSEC)    As of today, STOP taking any Aspirin (unless otherwise instructed by your surgeon) Aleve, Naproxen, Ibuprofen, Motrin, Advil, Goody's, BC's, all herbal medications, fish oil, and all vitamins.                       Do NOT Smoke (Tobacco/Vaping) or drink Alcohol 24 hours prior to your procedure.  If you use a CPAP at night, you may bring all equipment for your overnight stay.   Contacts, glasses, piercing's, hearing aid's, dentures or partials may not be worn into surgery, please bring cases for these belongings.    For patients admitted to the hospital, discharge time will be determined by your treatment team.    Patients discharged the day of surgery will not be allowed to drive home, and someone needs to stay with them for 24 hours.  ONLY 1 SUPPORT PERSON MAY BE PRESENT WHILE YOU ARE IN SURGERY. IF YOU ARE TO BE ADMITTED ONCE YOU ARE IN YOUR ROOM YOU WILL BE ALLOWED TWO (2) VISITORS.  Minor children may have two parents present. Special consideration for safety and communication needs will be reviewed on a case by case basis.   Special instructions:   - Preparing For Surgery  Before surgery, you can play an important role. Because skin is not sterile, your skin needs to be as free of germs as possible. You can reduce the number of germs on your skin by washing with CHG (chlorahexidine gluconate) Soap before surgery.  CHG is an antiseptic cleaner which kills germs and bonds with the skin to continue killing germs even after washing.    Oral Hygiene is also important to reduce your risk of infection.  Remember - BRUSH YOUR TEETH THE MORNING OF SURGERY WITH YOUR REGULAR TOOTHPASTE  Please do not use if you have an allergy to CHG or antibacterial soaps. If your skin becomes reddened/irritated stop using the CHG.  Do not shave (including legs and underarms) for at least 48 hours prior to first CHG shower. It is OK to shave your face.  Please follow these instructions carefully.   Shower  the NIGHT BEFORE SURGERY and the MORNING OF SURGERY  If you chose to wash your hair, wash your hair first as usual with your normal shampoo.  After you shampoo, rinse your hair and body thoroughly to remove the shampoo.  Use CHG Soap as you would any other liquid soap. You can apply CHG directly to the skin and wash gently with a scrungie or a clean washcloth.   Apply the CHG Soap to your body ONLY FROM THE NECK DOWN.  Do not use on open wounds or open sores. Avoid contact with your eyes, ears, mouth and genitals (private parts). Wash Face and genitals (private parts)  with your normal soap.   Wash  thoroughly, paying special attention to the area where your surgery will be performed.  Thoroughly rinse your body with warm water from the neck down.  DO NOT shower/wash with your normal soap after using and rinsing off the CHG Soap.  Pat yourself dry with a CLEAN TOWEL.  Wear CLEAN PAJAMAS to bed the night before surgery  Place CLEAN SHEETS on your bed the night before your surgery  DO NOT SLEEP WITH PETS.   Day of Surgery: Shower with CHG soap. Do not wear jewelry, make up, nail polish, gel polish, artificial nails, or any other type of covering on natural nails including finger and toenails. If patients have artificial nails, gel coating, etc. that need to be removed by a nail salon please have this removed prior to surgery. Surgery may need to be canceled/delayed if the surgeon/ anesthesia feels like the patient is unable to be adequately monitored. Do not wear lotions, powders, perfumes/colognes, or deodorant. Do not shave 48 hours prior to surgery.  Men may shave face and neck. Do not bring valuables to the hospital. [facility] is not responsible for any belongings or valuables. Wear Clean/Comfortable clothing the morning of surgery Remember to brush your teeth WITH YOUR REGULAR TOOTHPASTE.   Please read over the following fact sheets that you were given.

## 2020-12-09 NOTE — Pre-Procedure Instructions (Signed)
Surgical Instructions    Your procedure is scheduled on Tuesday July 5th.  Report to [facility] Main Entrance "A" at 10:30 A.M., then check in with the Admitting office.  Call this number if you have problems the morning of surgery:  [phone]   If you have any questions prior to your surgery date call [phone]: Open Monday-Friday 8am-4pm    Remember:  Do not eat after midnight the night before your surgery  You may drink clear liquids until 09:30 A.M. the morning of your surgery.   Clear liquids allowed are: Water, Non-Citrus Juices (without pulp), Carbonated Beverages, Clear Tea, Black Coffee Only, and Gatorade Patient Instructions  The night before surgery:  No food after midnight. ONLY clear liquids after midnight  The day of surgery (if you do NOT have diabetes):  Drink ONE (1) Pre-Surgery Clear Ensure by 09:30 A.M. the morning of surgery. Drink in one sitting. Do not sip.  This drink was given to you during your hospital  pre-op appointment visit.  Nothing else to drink after completing the  Pre-Surgery Clear Ensure.         If you have questions, please contact your surgeon's office.     Take these medicines the morning of surgery with A SIP OF WATER   cetirizine (ZYRTEC)- If needed  mometasone (NASONEX)- If needed  omeprazole (PRILOSEC)    As of today, STOP taking any Aspirin (unless otherwise instructed by your surgeon) Aleve, Naproxen, Ibuprofen, Motrin, Advil, Goody's, BC's, all herbal medications, fish oil, and all vitamins.                       Do NOT Smoke (Tobacco/Vaping) or drink Alcohol 24 hours prior to your procedure.  If you use a CPAP at night, you may bring all equipment for your overnight stay.   Contacts, glasses, piercing's, hearing aid's, dentures or partials may not be worn into surgery, please bring cases for these belongings.    For patients admitted to the hospital, discharge time will be determined by your treatment team.    Patients discharged the day of surgery will not be allowed to drive home, and someone needs to stay with them for 24 hours.  ONLY 1 SUPPORT PERSON MAY BE PRESENT WHILE YOU ARE IN SURGERY. IF YOU ARE TO BE ADMITTED ONCE YOU ARE IN YOUR ROOM YOU WILL BE ALLOWED TWO (2) VISITORS.  Minor children may have two parents present. Special consideration for safety and communication needs will be reviewed on a case by case basis.    [place]- Preparing for Shoulder Surgery  ?  Before surgery, you can play an important role. Because skin is not sterile, your skin needs to be as free of germs as possible. You can reduce the number of germs on your skin by using the following products.   1). Benzoyl Peroxide Gel: reduces the number of germs present on the skin   *Applied twice a day to shoulder area starting two days before surgery     2). Chlorhexidine Gluconate (CHG) Soap: An antiseptic cleaner that kills germs and bonds with the skin to continue killing germs even after washing   *Used for showering the night before surgery and morning of surgery   ?    Please follow these instructions carefully:     1). BENZOYL PEROXIDE 5% GEL (Please do not use if you have an allergy to benzoyl peroxide.   If your skin becomes reddened/irritated stop using the  benzoyl peroxide)     Starting TWO DAYS BEFORE surgery:    Apply benzoyl peroxide in the morning and at night. Apply after taking a shower. If you are not taking a shower clean entire shoulder front, back, and side along with the armpit with a clean wet washcloth.     Place a quarter-sized amount of gel on your shoulder and rub in thoroughly, making sure to cover the front, back, and side of your shoulder, along with the armpit.                           Do this twice a day for two days.  (Last application is the night before surgery, AFTER using the CHG soap as described below).   Do NOT apply benzoyl peroxide gel on the day of  surgery.   2 days before ____ AM   ____ PM              1 day before ____ AM   ____ PM    2) CHG Soap: Please do not use if you have an allergy to CHG or antibacterial soaps. If your skin becomes reddened/irritated stop using the CHG.  Do not shave (including legs and underarms) for at least 48 hours prior to first CHG shower. It is OK to shave your face.  Please follow these instructions carefully.   Shower the NIGHT BEFORE SURGERY (before applying benzoyl peroxide gel) and the MORNING OF SURGERY with CHG Soap.   If you chose to wash your hair, wash your hair first as usual with your normal shampoo.  After you shampoo, rinse your hair and body thoroughly to remove the shampoo.  Use CHG as you would any other liquid soap. You can apply CHG directly to the skin and wash gently with a scrungie or a clean washcloth.   Apply the CHG Soap to your body ONLY FROM THE NECK DOWN.  Do not use on open wounds or open sores. Avoid contact with your eyes, ears, mouth and genitals (private parts). Wash Face and genitals (private parts) with your normal soap.   Wash thoroughly, paying special attention to the area where your surgery will be performed.  Thoroughly rinse your body with warm water from the neck down.  DO NOT shower/wash with your normal soap after using and rinsing off the CHG Soap.  Pat yourself dry with a CLEAN TOWEL.  Wear CLEAN PAJAMAS to bed the night before surgery  Place CLEAN SHEETS on your bed the night of your first shower and DO NOT SLEEP WITH PETS.  Oral Hygiene is also important to reduce your risk of infection.  Remember - BRUSH YOUR TEETH THE MORNING OF SURGERY WITH YOUR REGULAR TOOTHPASTE  Day of Surgery: Wear Clean/Comfortable clothing the morning of surgery Do not apply any deodorants/lotions.   Remember to brush your teeth WITH YOUR REGULAR TOOTHPASTE.       Please read over the fact sheets that you were given.

## 2020-12-10 ENCOUNTER — Other Ambulatory Visit: Payer: Self-pay

## 2020-12-10 ENCOUNTER — Encounter (HOSPITAL_COMMUNITY): Payer: Self-pay

## 2020-12-10 ENCOUNTER — Encounter (HOSPITAL_COMMUNITY)
Admission: RE | Admit: 2020-12-10 | Discharge: 2020-12-10 | Disposition: A | Discharge status: Home / Self Care | Payer: BC Managed Care – PPO | Source: Ambulatory Visit | Attending: Orthopedic Surgery | Admitting: Orthopedic Surgery

## 2020-12-10 DIAGNOSIS — Z01812 Encounter for preprocedural laboratory examination: Secondary | ICD-10-CM | POA: Diagnosis not present

## 2020-12-10 HISTORY — DX: Personal history of urinary calculi: Z87.442

## 2020-12-10 HISTORY — DX: Unspecified osteoarthritis, unspecified site: M19.90

## 2020-12-10 HISTORY — DX: Essential (primary) hypertension: I10

## 2020-12-10 LAB — BASIC METABOLIC PANEL
Anion gap: 7 (ref 5–15)
BUN: 13 mg/dL (ref 6–20)
CO2: 27 mmol/L (ref 22–32)
Calcium: 9.1 mg/dL (ref 8.9–10.3)
Chloride: 105 mmol/L (ref 98–111)
Creatinine, Ser: 1.01 mg/dL (ref 0.61–1.24)
GFR, Estimated: >60 mL/min (ref >60)
Glucose, Bld: 143 mg/dL — ABNORMAL HIGH (ref 70–99)
Potassium: 4.1 mmol/L (ref 3.5–5.1)
Sodium: 139 mmol/L (ref 135–145)

## 2020-12-10 LAB — CBC
HCT: 43.4 % (ref 39.0–52.0)
Hemoglobin: 14.3 g/dL (ref 13.0–17.0)
MCH: 28.7 pg (ref 26.0–34.0)
MCHC: 32.9 g/dL (ref 30.0–36.0)
MCV: 87 fL (ref 80.0–100.0)
Platelets: 236 10*3/uL (ref 150–400)
RBC: 4.99 MIL/uL (ref 4.22–5.81)
RDW: 12.6 % (ref 11.5–15.5)
WBC: 7.7 10*3/uL (ref 4.0–10.5)
nRBC: 0 % (ref 0.0–0.2)

## 2020-12-10 LAB — SURGICAL PCR SCREEN
MRSA, PCR: NEGATIVE
Staphylococcus aureus: POSITIVE — AB

## 2020-12-10 NOTE — Progress Notes (Addendum)
Left message with Fayette Pho at Dr. Dennie Maizes office informing her that pt's surgical PCR was positive for staph. Pt will receive Profend  DOS per anesthesia protocol.

## 2020-12-10 NOTE — Progress Notes (Signed)
PCP - Mitzie Na. Daniel MD  Cardiologist -denies  PPM/ICD - denies Device Orders -  Rep Notified -   Chest x-ray - 04/09/20 EKG - 04/21/20 Stress Test - denies ECHO - denies Cardiac Cath - denies  Sleep Study - denies CPAP - n/a  Fasting Blood Sugar - n/a Checks Blood Sugar _____ times a day  Blood Thinner Instructions:n/a Aspirin Instructions:n/a  ERAS Protcol - yes-clears until 0930  PRE-SURGERY Ensure or G2- ENsure  COVID TEST- no;ambulatory surgery   Anesthesia review: no  Patient denies shortness of breath, fever, cough and chest pain at PAT appointment   All instructions explained to the patient, with a verbal understanding of the material. Patient agrees to go over the instructions while at home for a better understanding. Patient also instructed to self quarantine after being tested for COVID-19. The opportunity to ask questions was provided.

## 2020-12-15 ENCOUNTER — Ambulatory Visit (HOSPITAL_COMMUNITY): Payer: BC Managed Care – PPO

## 2020-12-15 ENCOUNTER — Ambulatory Visit (HOSPITAL_COMMUNITY): Payer: BC Managed Care – PPO | Admitting: Certified Registered"

## 2020-12-15 ENCOUNTER — Encounter (HOSPITAL_COMMUNITY): Payer: Self-pay | Admitting: Orthopedic Surgery

## 2020-12-15 ENCOUNTER — Other Ambulatory Visit: Payer: Self-pay

## 2020-12-15 ENCOUNTER — Encounter (HOSPITAL_COMMUNITY)
Admission: RE | Disposition: A | Discharge status: Home / Self Care | Payer: Self-pay | Source: Home / Self Care | Attending: Orthopedic Surgery

## 2020-12-15 ENCOUNTER — Observation Stay (HOSPITAL_COMMUNITY)
Admission: RE | Admit: 2020-12-15 | Discharge: 2020-12-16 | Disposition: A | Discharge status: Home / Self Care | Payer: BC Managed Care – PPO | Attending: Orthopedic Surgery | Admitting: Orthopedic Surgery

## 2020-12-15 DIAGNOSIS — I10 Essential (primary) hypertension: Secondary | ICD-10-CM | POA: Insufficient documentation

## 2020-12-15 DIAGNOSIS — M19011 Primary osteoarthritis, right shoulder: Secondary | ICD-10-CM | POA: Diagnosis not present

## 2020-12-15 DIAGNOSIS — Z79899 Other long term (current) drug therapy: Secondary | ICD-10-CM | POA: Diagnosis not present

## 2020-12-15 DIAGNOSIS — Z96611 Presence of right artificial shoulder joint: Secondary | ICD-10-CM

## 2020-12-15 DIAGNOSIS — Z7982 Long term (current) use of aspirin: Secondary | ICD-10-CM | POA: Diagnosis not present

## 2020-12-15 HISTORY — PX: TOTAL SHOULDER ARTHROPLASTY: SHX126

## 2020-12-15 SURGERY — ARTHROPLASTY, SHOULDER, TOTAL
Anesthesia: General | Site: Shoulder | Laterality: Right

## 2020-12-15 MED ORDER — FENTANYL CITRATE (PF) 100 MCG/2ML IJ SOLN: 100.0000 ug | Freq: Once | INTRAMUSCULAR | Status: AC

## 2020-12-15 MED ORDER — FENTANYL CITRATE (PF) 250 MCG/5ML IJ SOLN
INTRAMUSCULAR | Status: AC
  Filled 2020-12-15: qty 5

## 2020-12-15 MED ORDER — AMISULPRIDE (ANTIEMETIC) 5 MG/2ML IV SOLN: 10.0000 mg | Freq: Once | INTRAVENOUS | Status: DC | PRN

## 2020-12-15 MED ORDER — ONDANSETRON HCL 4 MG PO TABS: 4.0000 mg | ORAL_TABLET | Freq: Four times a day (QID) | ORAL | Status: DC | PRN

## 2020-12-15 MED ORDER — FENTANYL CITRATE (PF) 250 MCG/5ML IJ SOLN
INTRAMUSCULAR | Status: DC | PRN
  Administered 2020-12-15: 150 ug via INTRAVENOUS
  Administered 2020-12-15: 100 ug via INTRAVENOUS

## 2020-12-15 MED ORDER — PROPOFOL 10 MG/ML IV BOLUS
INTRAVENOUS | Status: DC | PRN
  Administered 2020-12-15: 20 mg via INTRAVENOUS
  Administered 2020-12-15: 160 mg via INTRAVENOUS
  Administered 2020-12-15: 40 mg via INTRAVENOUS

## 2020-12-15 MED ORDER — HYDROCODONE-ACETAMINOPHEN 5-325 MG PO TABS
1.0000 | ORAL_TABLET | ORAL | Status: DC | PRN
  Administered 2020-12-15 – 2020-12-16 (×2): 1 via ORAL
  Filled 2020-12-15 (×2): qty 1

## 2020-12-15 MED ORDER — FENTANYL CITRATE (PF) 100 MCG/2ML IJ SOLN
INTRAMUSCULAR | Status: AC
  Administered 2020-12-15: 100 ug via INTRAVENOUS
  Filled 2020-12-15: qty 2

## 2020-12-15 MED ORDER — ACETAMINOPHEN 500 MG PO TABS
500.0000 mg | ORAL_TABLET | Freq: Four times a day (QID) | ORAL | Status: AC
  Administered 2020-12-15 – 2020-12-16 (×4): 500 mg via ORAL
  Filled 2020-12-15 (×3): qty 1

## 2020-12-15 MED ORDER — 0.9 % SODIUM CHLORIDE (POUR BTL) OPTIME
TOPICAL | Status: DC | PRN
  Administered 2020-12-15: 1000 mL

## 2020-12-15 MED ORDER — CEFAZOLIN IN SODIUM CHLORIDE 3-0.9 GM/100ML-% IV SOLN
3.0000 g | INTRAVENOUS | Status: AC
  Administered 2020-12-15: 3 g via INTRAVENOUS
  Filled 2020-12-15: qty 100

## 2020-12-15 MED ORDER — LACTATED RINGERS IV SOLN: INTRAVENOUS | Status: DC

## 2020-12-15 MED ORDER — LISINOPRIL 10 MG PO TABS
10.0000 mg | ORAL_TABLET | Freq: Every day | ORAL | Status: DC
  Administered 2020-12-15 – 2020-12-16 (×2): 10 mg via ORAL
  Filled 2020-12-15 (×2): qty 1

## 2020-12-15 MED ORDER — ONDANSETRON HCL 4 MG/2ML IJ SOLN: 4.0000 mg | Freq: Four times a day (QID) | INTRAMUSCULAR | Status: DC | PRN

## 2020-12-15 MED ORDER — CEFAZOLIN SODIUM-DEXTROSE 1-4 GM/50ML-% IV SOLN
1.0000 g | Freq: Four times a day (QID) | INTRAVENOUS | Status: AC
  Administered 2020-12-15 – 2020-12-16 (×3): 1 g via INTRAVENOUS
  Filled 2020-12-15 (×3): qty 50

## 2020-12-15 MED ORDER — FENTANYL CITRATE (PF) 100 MCG/2ML IJ SOLN: 25.0000 ug | INTRAMUSCULAR | Status: DC | PRN

## 2020-12-15 MED ORDER — HYDROCODONE-ACETAMINOPHEN 7.5-325 MG PO TABS
1.0000 | ORAL_TABLET | ORAL | Status: DC | PRN
  Administered 2020-12-15 – 2020-12-16 (×2): 1 via ORAL
  Filled 2020-12-15 (×3): qty 1

## 2020-12-15 MED ORDER — ASPIRIN EC 325 MG PO TBEC
325.0000 mg | DELAYED_RELEASE_TABLET | Freq: Every day | ORAL | Status: DC
  Administered 2020-12-15 – 2020-12-16 (×2): 325 mg via ORAL
  Filled 2020-12-15 (×2): qty 1

## 2020-12-15 MED ORDER — ONDANSETRON HCL 4 MG/2ML IJ SOLN
INTRAMUSCULAR | Status: DC | PRN
  Administered 2020-12-15: 4 mg via INTRAVENOUS

## 2020-12-15 MED ORDER — MORPHINE SULFATE (PF) 2 MG/ML IV SOLN: 0.5000 mg | INTRAVENOUS | Status: DC | PRN

## 2020-12-15 MED ORDER — LIDOCAINE 2% (20 MG/ML) 5 ML SYRINGE
INTRAMUSCULAR | Status: DC | PRN
  Administered 2020-12-15: 100 mg via INTRAVENOUS

## 2020-12-15 MED ORDER — METHOCARBAMOL 500 MG PO TABS: 500.0000 mg | ORAL_TABLET | Freq: Four times a day (QID) | ORAL | 0 refills | Status: AC | PRN

## 2020-12-15 MED ORDER — ORAL CARE MOUTH RINSE: 15.0000 mL | Freq: Once | OROMUCOSAL | Status: AC

## 2020-12-15 MED ORDER — VANCOMYCIN HCL 1000 MG IV SOLR
INTRAVENOUS | Status: AC
  Filled 2020-12-15: qty 1000

## 2020-12-15 MED ORDER — MENTHOL 3 MG MT LOZG: 1.0000 | LOZENGE | OROMUCOSAL | Status: DC | PRN

## 2020-12-15 MED ORDER — ROCURONIUM BROMIDE 10 MG/ML (PF) SYRINGE
PREFILLED_SYRINGE | INTRAVENOUS | Status: DC | PRN
  Administered 2020-12-15: 100 mg via INTRAVENOUS

## 2020-12-15 MED ORDER — CHLORHEXIDINE GLUCONATE 0.12 % MT SOLN
15.0000 mL | Freq: Once | OROMUCOSAL | Status: AC
  Administered 2020-12-15: 15 mL via OROMUCOSAL
  Filled 2020-12-15: qty 15

## 2020-12-15 MED ORDER — MIDAZOLAM HCL 2 MG/2ML IJ SOLN
INTRAMUSCULAR | Status: AC
  Filled 2020-12-15: qty 2

## 2020-12-15 MED ORDER — PHENOL 1.4 % MT LIQD: 1.0000 | OROMUCOSAL | Status: DC | PRN

## 2020-12-15 MED ORDER — METOCLOPRAMIDE HCL 5 MG/ML IJ SOLN
5.0000 mg | Freq: Three times a day (TID) | INTRAMUSCULAR | Status: DC | PRN
Start: 2020-12-15 — End: 2020-12-16

## 2020-12-15 MED ORDER — DEXMEDETOMIDINE (PRECEDEX) IN NS 20 MCG/5ML (4 MCG/ML) IV SYRINGE
PREFILLED_SYRINGE | INTRAVENOUS | Status: DC | PRN
  Administered 2020-12-15: 16 ug via INTRAVENOUS

## 2020-12-15 MED ORDER — PHENYLEPHRINE HCL-NACL 10-0.9 MG/250ML-% IV SOLN
INTRAVENOUS | Status: DC | PRN
  Administered 2020-12-15: 25 ug/min via INTRAVENOUS

## 2020-12-15 MED ORDER — MIDAZOLAM HCL 2 MG/2ML IJ SOLN
INTRAMUSCULAR | Status: AC
  Administered 2020-12-15: 2 mg via INTRAVENOUS
  Filled 2020-12-15: qty 2

## 2020-12-15 MED ORDER — TRANEXAMIC ACID-NACL 1000-0.7 MG/100ML-% IV SOLN
1000.0000 mg | INTRAVENOUS | Status: DC
  Filled 2020-12-15: qty 100

## 2020-12-15 MED ORDER — ACETAMINOPHEN 325 MG PO TABS
325.0000 mg | ORAL_TABLET | Freq: Four times a day (QID) | ORAL | Status: DC | PRN
Start: 2020-12-16 — End: 2020-12-16

## 2020-12-15 MED ORDER — MIDAZOLAM HCL 2 MG/2ML IJ SOLN: 2.0000 mg | Freq: Once | INTRAMUSCULAR | Status: AC

## 2020-12-15 MED ORDER — VANCOMYCIN HCL 1000 MG IV SOLR
INTRAVENOUS | Status: DC | PRN
  Administered 2020-12-15: 1000 mg via TOPICAL

## 2020-12-15 MED ORDER — PANTOPRAZOLE SODIUM 40 MG PO TBEC
40.0000 mg | DELAYED_RELEASE_TABLET | Freq: Every day | ORAL | Status: DC
  Administered 2020-12-16: 40 mg via ORAL
  Filled 2020-12-15: qty 1

## 2020-12-15 MED ORDER — BUPIVACAINE LIPOSOME 1.3 % IJ SUSP
INTRAMUSCULAR | Status: DC | PRN
  Administered 2020-12-15: 133 mg via PERINEURAL

## 2020-12-15 MED ORDER — ONDANSETRON 4 MG PO TBDP: 4.0000 mg | ORAL_TABLET | Freq: Three times a day (TID) | ORAL | 0 refills | Status: AC | PRN

## 2020-12-15 MED ORDER — NAPROXEN 250 MG PO TABS
250.0000 mg | ORAL_TABLET | Freq: Two times a day (BID) | ORAL | Status: DC
  Administered 2020-12-16: 250 mg via ORAL
  Filled 2020-12-15: qty 1

## 2020-12-15 MED ORDER — OXYCODONE HCL 5 MG PO TABS: 5.0000 mg | ORAL_TABLET | ORAL | 0 refills | Status: AC | PRN

## 2020-12-15 MED ORDER — METOCLOPRAMIDE HCL 5 MG PO TABS: 5.0000 mg | ORAL_TABLET | Freq: Three times a day (TID) | ORAL | Status: DC | PRN

## 2020-12-15 MED ORDER — SUGAMMADEX SODIUM 200 MG/2ML IV SOLN
INTRAVENOUS | Status: DC | PRN
  Administered 2020-12-15: 100 mg via INTRAVENOUS

## 2020-12-15 MED ORDER — DOCUSATE SODIUM 100 MG PO CAPS
100.0000 mg | ORAL_CAPSULE | Freq: Two times a day (BID) | ORAL | Status: DC
  Administered 2020-12-15 – 2020-12-16 (×2): 100 mg via ORAL
  Filled 2020-12-15 (×2): qty 1

## 2020-12-15 MED ORDER — BUPIVACAINE-EPINEPHRINE (PF) 0.5% -1:200000 IJ SOLN
INTRAMUSCULAR | Status: DC | PRN
  Administered 2020-12-15: 15 mL via PERINEURAL

## 2020-12-15 MED ORDER — TRANEXAMIC ACID-NACL 1000-0.7 MG/100ML-% IV SOLN
INTRAVENOUS | Status: DC | PRN
  Administered 2020-12-15: 1000 mg via INTRAVENOUS

## 2020-12-15 SURGICAL SUPPLY — 62 items
ALCOHOL 70% 16 OZ (MISCELLANEOUS) ×2 IMPLANT
ANCHOR SUT BIO SW 4.75X19.1 (Anchor) ×2 IMPLANT
BAG COUNTER SPONGE SURGICOUNT (BAG) ×2 IMPLANT
BIT DRILL 5/64X5 DISP (BIT) IMPLANT
BLADE SAG 18X100X1.27 (BLADE) ×2 IMPLANT
BODY TRUNION ECLIPSE 45 SL (Shoulder) ×2 IMPLANT
CALIBRATOR GLENOID VIP 5-D (SYSTAGENIX WOUND MANAGEMENT) ×2 IMPLANT
CEMENT BONE R 1X40 (Cement) ×2 IMPLANT
COVER SURGICAL LIGHT HANDLE (MISCELLANEOUS) ×2 IMPLANT
DRAPE INCISE IOBAN 66X45 STRL (DRAPES) IMPLANT
DRAPE ORTHO SPLIT 77X108 STRL (DRAPES) ×4
DRAPE SURG ORHT 6 SPLT 77X108 (DRAPES) ×2 IMPLANT
DRAPE U-SHAPE 47X51 STRL (DRAPES) ×2 IMPLANT
DRSG ADAPTIC 3X8 NADH LF (GAUZE/BANDAGES/DRESSINGS) IMPLANT
DRSG AQUACEL AG ADV 3.5X10 (GAUZE/BANDAGES/DRESSINGS) ×2 IMPLANT
DRSG PAD ABDOMINAL 8X10 ST (GAUZE/BANDAGES/DRESSINGS) IMPLANT
DURAPREP 26ML APPLICATOR (WOUND CARE) ×2 IMPLANT
ELECT BLADE 4.0 EZ CLEAN MEGAD (MISCELLANEOUS) ×2
ELECT REM PT RETURN 9FT ADLT (ELECTROSURGICAL) ×2
ELECTRODE BLDE 4.0 EZ CLN MEGD (MISCELLANEOUS) ×1 IMPLANT
ELECTRODE REM PT RTRN 9FT ADLT (ELECTROSURGICAL) ×1 IMPLANT
GAUZE SPONGE 4X4 12PLY STRL (GAUZE/BANDAGES/DRESSINGS) IMPLANT
GLENOID UNI VAULTLOCK LRG (Shoulder) ×2 IMPLANT
GLOVE SRG 8 PF TXTR STRL LF DI (GLOVE) ×2 IMPLANT
GLOVE SURG ENC MOIS LTX SZ7.5 (GLOVE) ×8 IMPLANT
GLOVE SURG UNDER POLY LF SZ8 (GLOVE) ×2
GOWN STRL REUS W/ TWL LRG LVL3 (GOWN DISPOSABLE) ×1 IMPLANT
GOWN STRL REUS W/ TWL XL LVL3 (GOWN DISPOSABLE) ×2 IMPLANT
GOWN STRL REUS W/TWL LRG LVL3 (GOWN DISPOSABLE) ×1
GOWN STRL REUS W/TWL XL LVL3 (GOWN DISPOSABLE) ×2
HEAD HUM ECLIPSE 45/19 (Shoulder) ×2 IMPLANT
IMPL ECLIPSE SPEEDCAP (Shoulder) ×1 IMPLANT
IMPLANT ECLIPSE SPEEDCAP (Shoulder) ×2 IMPLANT
KIT BASIN OR (CUSTOM PROCEDURE TRAY) ×2 IMPLANT
KIT TURNOVER KIT B (KITS) ×2 IMPLANT
MANIFOLD NEPTUNE II (INSTRUMENTS) ×2 IMPLANT
NEEDLE TAPERED W/ NITINOL LOOP (MISCELLANEOUS) IMPLANT
NS IRRIG 1000ML POUR BTL (IV SOLUTION) ×2 IMPLANT
PACK SHOULDER (CUSTOM PROCEDURE TRAY) ×2 IMPLANT
PAD ARMBOARD 7.5X6 YLW CONV (MISCELLANEOUS) ×4 IMPLANT
PIN NITINOL TARGETER 2.8 (PIN) ×2 IMPLANT
RESTRAINT HEAD UNIVERSAL NS (MISCELLANEOUS) ×2 IMPLANT
SCREW SPINAL 40 F/CAGE LRG (Screw) ×2 IMPLANT
SIZER ECLIPSE CAGE SCREW (ORTHOPEDIC DISPOSABLE SUPPLIES) ×2 IMPLANT
SLING ARM FOAM STRAP LRG (SOFTGOODS) IMPLANT
SLING ARM FOAM STRAP MED (SOFTGOODS) IMPLANT
SLING ARM IMMOBILIZER XL (CAST SUPPLIES) ×2 IMPLANT
SPONGE T-LAP 18X18 ~~LOC~~+RFID (SPONGE) ×2 IMPLANT
SPONGE T-LAP 4X18 ~~LOC~~+RFID (SPONGE) ×2 IMPLANT
STRIP CLOSURE SKIN 1/2X4 (GAUZE/BANDAGES/DRESSINGS) ×4 IMPLANT
SUCTION FRAZIER HANDLE 10FR (MISCELLANEOUS) ×2
SUCTION TUBE FRAZIER 10FR DISP (MISCELLANEOUS) ×1 IMPLANT
SUT FIBERWIRE #2 38 T-5 BLUE (SUTURE) ×8
SUT MNCRL AB 4-0 PS2 18 (SUTURE) IMPLANT
SUT VIC AB 1 CT1 27 (SUTURE) ×1
SUT VIC AB 1 CT1 27XBRD ANBCTR (SUTURE) ×1 IMPLANT
SUT VIC AB 2-0 CT1 27 (SUTURE) ×2
SUT VIC AB 2-0 CT1 TAPERPNT 27 (SUTURE) ×1 IMPLANT
SUTURE FIBERWR #2 38 T-5 BLUE (SUTURE) ×4 IMPLANT
TOWEL GREEN STERILE (TOWEL DISPOSABLE) ×2 IMPLANT
WATER STERILE IRR 1000ML POUR (IV SOLUTION) ×2 IMPLANT
YANKAUER SUCT BULB TIP NO VENT (SUCTIONS) ×2 IMPLANT

## 2020-12-15 NOTE — H&P (Signed)
ORTHOPAEDIC H and P  REQUESTING PHYSICIAN: Nicholes Stairs, MD  PCP:  Loa Socks, MD  Chief Complaint: Right shoulder osteoarthritis  HPI: Cory Christensen is a 50 y.o. male who complains of right shoulder pain and has been recalcitrant to conservative care.  He has end-stage osteoarthritis in the right shoulder.  Here today for total shoulder arthroplasty.  Past Medical History:  Diagnosis Date   Arthritis    GERD (gastroesophageal reflux disease)    History of kidney stones    Hypertension    Past Surgical History:  Procedure Laterality Date   COLONOSCOPY N/A 06/08/2018   Procedure: COLONOSCOPY;  Surgeon: Danie Binder, MD;  Location: [facility];  Service: Endoscopy;  Laterality: N/A;  9:30   POLYPECTOMY  06/08/2018   Procedure: POLYPECTOMY;  Surgeon: Danie Binder, MD;  Location: [facility];  Service: Endoscopy;;  transverse colon,rectum   Social History   Socioeconomic History   Marital status: Married    Spouse name: Not on file   Number of children: Not on file   Years of education: Not on file   Highest education level: Not on file  Occupational History   Not on file  Tobacco Use   Smoking status: Never   Smokeless tobacco: Never  Vaping Use   Vaping Use: Never used  Substance and Sexual Activity   Alcohol use: No   Drug use: Never   Sexual activity: Not on file  Other Topics Concern   Not on file  Social History Narrative   Not on file   Social Determinants of Health   Financial Resource Strain: Not on file  Food Insecurity: Not on file  Transportation Needs: Not on file  Physical Activity: Not on file  Stress: Not on file  Social Connections: Not on file   Family History  Problem Relation Age of Onset   Colon cancer Father        AGE < 76   Coronary artery disease Father 70   Healthy Mother    Crohn's disease Neg Hx    No Known Allergies Prior to Admission medications   Medication Sig Start Date End Date Taking?  Authorizing Provider  lisinopril (ZESTRIL) 10 MG tablet Take 10 mg by mouth daily. 10/29/20  Yes [provider]  omeprazole (PRILOSEC) 40 MG capsule Take 40 mg by mouth daily.   Yes [provider]  aspirin EC 81 MG tablet Take 1 tablet (81 mg total) by mouth daily. Swallow whole. Patient not taking: Reported on 12/02/2020 04/17/20   End, Harrell Gave, MD  cetirizine (ZYRTEC) 10 MG tablet Take 10 mg by mouth daily as needed for allergies.    [provider]  ibuprofen (ADVIL) 200 MG tablet Take 400 mg by mouth every 6 (six) hours as needed for headache or moderate pain.    [provider]  mometasone (NASONEX) 50 MCG/ACT nasal spray Place 2 sprays into the nose daily as needed (allergies).    [provider]   No results found.  Positive ROS: All other systems have been reviewed and were otherwise negative with the exception of those mentioned in the HPI and as above.  Physical Exam: General: Alert, no acute distress Cardiovascular: No pedal edema Respiratory: No cyanosis, no use of accessory musculature GI: No organomegaly, abdomen is soft and non-tender Skin: No lesions in the area of chief complaint Neurologic: Sensation intact distally Psychiatric: Patient is competent for consent with normal mood and affect  Lymphatic: No axillary or cervical lymphadenopathy  MUSCULOSKELETAL:  Right upper extremity is warm and well-perfused with no obvious defects no deficits.  Assessment: Right shoulder end-stage osteoarthritis  Plan: -Plan to proceed today with total shoulder arthroplasty of the right shoulder.  This would be an anatomic replacement.  We again discussed the risk of bleeding, infection, damage to surrounding nerves and vessels, dislocation, fracture, failure of rotator cuff in the future, need for revision surgery in the future, and the risk of anesthesia.  He has provided informed consent.  -We will plan for discharge home  postoperatively from PACU.    Nicholes Stairs, MD Cell [phone]    12/15/2020 12:17 PM

## 2020-12-15 NOTE — Brief Op Note (Signed)
12/15/2020  2:52 PM  PATIENT:  Cory Christensen  50 y.o. male  PRE-OPERATIVE DIAGNOSIS:  Right shoulder osteoarthritis  POST-OPERATIVE DIAGNOSIS:  Right shoulder osteoarthritis  PROCEDURE:  Procedure(s) with comments: TOTAL SHOULDER ARTHROPLASTY anatomic (Right) - 2.5 hrs  SURGEON:  Surgeon(s) and Role:    * Stann Mainland, Elly Modena, MD - Primary  PHYSICIAN ASSISTANT: Jonelle Sidle, PA-C   ANESTHESIA:   regional and general  EBL:  150 cc   BLOOD ADMINISTERED:none  DRAINS: none   LOCAL MEDICATIONS USED:  NONE  SPECIMEN:  No Specimen  DISPOSITION OF SPECIMEN:  N/A  COUNTS:  YES  TOURNIQUET:  * No tourniquets in log *  DICTATION: .Note written in EPIC  PLAN OF CARE: Discharge to home after PACU  PATIENT DISPOSITION:  PACU - hemodynamically stable.   Delay start of Pharmacological VTE agent (>24hrs) due to surgical blood loss or risk of bleeding: not applicable

## 2020-12-15 NOTE — Anesthesia Preprocedure Evaluation (Addendum)
Anesthesia Evaluation  Patient identified by MRN, date of birth, ID band Patient awake    Reviewed: Allergy & Precautions, NPO status , Patient's Chart, lab work & pertinent test results  History of Anesthesia Complications Negative for: history of anesthetic complications  Airway Mallampati: III  TM Distance: >3 FB Neck ROM: Full    Dental  (+) Dental Advisory Given   Pulmonary neg pulmonary ROS,    breath sounds clear to auscultation       Cardiovascular hypertension, Pt. on medications  Rhythm:Regular Rate:Normal     Neuro/Psych negative neurological ROS     GI/Hepatic Neg liver ROS, GERD  ,  Endo/Other  negative endocrine ROS  Renal/GU negative Renal ROS     Musculoskeletal  (+) Arthritis ,   Abdominal   Peds  Hematology negative hematology ROS (+)   Anesthesia Other Findings   Reproductive/Obstetrics                            Anesthesia Physical Anesthesia Plan  ASA: 3  Anesthesia Plan: General   Post-op Pain Management:  Regional for Post-op pain   Induction: Intravenous  PONV Risk Score and Plan: 2 and Dexamethasone, Ondansetron and Treatment may vary due to age or medical condition  Airway Management Planned: Oral ETT  Additional Equipment: None  Intra-op Plan:   Post-operative Plan: Extubation in OR  Informed Consent: I have reviewed the patients History and Physical, chart, labs and discussed the procedure including the risks, benefits and alternatives for the proposed anesthesia with the patient or authorized representative who has indicated his/her understanding and acceptance.     Dental advisory given  Plan Discussed with: CRNA  Anesthesia Plan Comments:        Anesthesia Quick Evaluation

## 2020-12-15 NOTE — Transfer of Care (Signed)
Immediate Anesthesia Transfer of Care Note  Patient: Cory Christensen  Procedure(s) Performed: TOTAL SHOULDER ARTHROPLASTY anatomic (Right: Shoulder)  Patient Location: PACU  Anesthesia Type:General  Level of Consciousness: drowsy and patient cooperative  Airway & Oxygen Therapy: Patient Spontanous Breathing and Patient connected to face mask oxygen  Post-op Assessment: Report given to RN and Post -op Vital signs reviewed and stable  Post vital signs: Reviewed and stable  Last Vitals:  Vitals Value Taken Time  BP 142/83 12/15/20 1530  Temp    Pulse 43 12/15/20 1530  Resp 23 12/15/20 1530  SpO2 95 % 12/15/20 1530  Vitals shown include unvalidated device data.  Last Pain:  Vitals:   12/15/20 1111  TempSrc: Oral  PainSc:          Complications: No notable events documented.

## 2020-12-15 NOTE — Discharge Instructions (Signed)
Orthopedic surgery discharge instructions:  -Maintain postoperative bandage until follow-up appointment.  This is waterproof, and you may begin showering on postoperative day #3.  Do not submerge underwater.  Maintain that bandage until your follow-up appointment in 2 weeks.  -No lifting with operateive arm.  You should also maintain your arm in sling at all times for the first 5 weeks postoperatively.  You may range the elbow, hand and wrist as tolerated.  You can remove the sling a couple of times per day with the arm directly by your side.  Do not actively move the shoulder.  -Apply ice liberally to the shoulder throughout the day.  For mild to moderate pain use Tylenol and Advil as needed around-the-clock.  For breakthrough pain use oxycodone as necessary.  -You will return to see Dr. Stann Mainland in the office in 2 weeks for routine postoperative check with x-rays.

## 2020-12-15 NOTE — Op Note (Signed)
Date of Surgery: 12/15/2020  INDICATIONS: Mr. Cory Christensen is a 50 y.o.-year-old male with a right end-stage osteoarthritis that has failed conservative management to this point.  He is here today for elective total shoulder arthroplasty on the right side.;  The Patient did consent to the procedure after discussion of the risks and benefits.  PREOPERATIVE DIAGNOSIS: Right end-stage osteoarthritis glenohumeral joint  POSTOPERATIVE DIAGNOSIS: Same.  PROCEDURE: Anatomic right total shoulder arthroplasty  SURGEON: Geralynn Rile, M.D.  ASSIST: Jonelle Sidle, PA-C  Assistant attestation:  PA Thereasa Solo was present for the entire procedure.  Participated in prepping and draping, approach to the shoulder, trialing implantation of final implants as well as complex closure of wound and application of sling..  ANESTHESIA:  general, regional  IV FLUIDS AND URINE: See anesthesia.  ESTIMATED BLOOD LOSS: 150 mL.  IMPLANTS:  Arthrex stemless Eclipse size 45 x 19 mm humeral head Standard baseplate for the humerus for the 45 mm head with a large cage screw Size large polyethylene glenoid Speed Scap repair kit for the subscapularis repair.  DRAINS:  None  COMPLICATIONS: None.  DESCRIPTION OF PROCEDURE: After obtaining informed consent,The patient was brought to the operating room and placed supine on the operating table.  The patient had been signed prior to the procedure and this was documented. The patient had the anesthesia placed by the anesthesiologist.  A time-out was performed to confirm that this was the correct patient, site, side and location. The patient did receive antibiotics prior to the incision and was re-dosed during the procedure as needed at indicated intervals.   The patient had the operative extremity prepped and draped in the standard surgical fashion.   The head and neck were nicely stabilized.   A 10 blade was used to make a standard deltopectoral approach to the shoulder.   Dissection was carried out through the subcutaneous tissue to where the deltopectoral interval was visualized and developed.  The cephalic vein was mobilized and retracted medially for the duration of the case.  The subacromial space was cleared bluntly retractors were placed appropriately deep to the pectoralis major tendon and deep to the deltoid muscle fascia.  The upper one third of the pectoralis muscle insertion on the humerus was sharply released.  There was no obvious tear of the supraspinatus, infraspinatus, or subscapularis or teres minor.  Next I performed a subscapularis takedown and a peel fashion.  Subscapularis, rotator interval, and the inferior capsule were all released.  There were inferior humeral osteophytes that were removed with rongeur and curved osteotome.  After, Releases, a humeral head osteotomy was performed in 30 of retroversion and at 135 head neck angle.  A protractor was placed within the glenoid vault to protect axillary nerve and posterior capsular structures.  Next we prepared the humerus.  For the stemless implant we sized to a 45 mm humeral head.  We then prepared the central osteotome.  We then drilled to a size large cage screw depth.  We then placed a humeral head protector and turned our attention to preparing the glenoid.  Retractors were placed anteriorly, superiorly, and posteriorly and the labrum was excised.  Exposure of the glenoid was obtained.  The center drill bit was used followed by sequential reaming.  Next using the pegged glenoid guide the holes were drilled appropriately.  The large size glenoid proved to be the appropriate size for this glenoid.  Next the holes were drilled and chiseled appropriately.  Glenoid  was washed with antibiotic irrigation.  Next glenoid vault was dried.  We trialed once again and were satisfied with the large sized the glenoid.  Next, using antibodies cementing the superior and inferior holes the glenoid was cemented into  place.  Of note, the center ball-like peg was filled with humeral head autograft and not cemented per the manufacturer's recommendation.  Excellent fixation of the glenoid was obtained.  We then turned our attention back to the humeral side.  The large cage screw was placed.  The final trunnion was impacted into place.  We then trialed humeral heads and found the 45 x 17 mm humeral head to be satisfactory.  This demonstrated posterior pushback of just at 50% with spontaneous reduction and likewise inferior pull down of 50% with spontaneous reduction.  That head was then removed and the final humeral head was malleted in place with the above size.   Lastly we turned our attention to the subscapularis repair.  The shoulder was copiously irrigated once again with antibiotic solution.  We utilized the speed scapularis repair kit.  However, when placing the medial anchors the inferior most and medial anchor did pull out due to the decortication of the lesser tuberosity with the osteophyte removal.  Therefore we placed a 4.75 mm swivel lock at the most inferior position and then used the trunnion holes that were available to loop a suture tape through the proximal humerus and the metal trunnion.  This had good bite and no pullout.  We then placed a third medial row anchor in the lesser tuberosity near the biceps groove.  We then passed all of these suture limbs through the tendon of the subscapularis with a free needle.  This left [place] with a total of 6 suture limbs +2 more suture limbs coming from our tag stitch during the subscapularis peel.  These were then placed into 2 separate 3.9 mm swivel lock anchors.  For suture tails into each anchor.  We then reduced the shoulder and placed drill holes into the biceps groove for repair using the swivel lock anchors.  The arm was in neutral rotation and 30 degrees of abduction.  Prior to tensioning into the greater tuberosity we did place 2 figure-of-eight sutures into the  lateral aspect of the rotator interval.  This set the appropriate length for subscapularis repair.  We then placed a swivel locks into the biceps groove.  This had excellent purchase.  The wound was once again copious bleed irrigated using antibiotic solution and then normal saline.  We evaluated once again for hemostasis.  1 g of vancomycin powder was placed into the deltopectoral interval.  The wound was closed in layers, with #1 Vicryl for the subcutaneous fat layer, and then 2-0 Vicryl for the deep dermal layer and 3-0 Monocryl in a subcuticular fashion followed by half-inch Steri-Strips cut in half..  A standard sterile occlusive dressing was applied as well as a postoperative sling.   The patient tolerated the procedure well.  All counts were correct 2.  There were no intraoperative complications.  The patient was transferred to PACU in stable condition.   Disposition: The patient will be nonweightbearing to the operative extremity for proximally 4-6 weeks.  He will begin physical therapy in the outpatient setting.  He will be discharged home from PACU.  Sling will be maintained at all times.  Return for wound check in approximately 2 weeks and then again at 6 weeks.  Nicholes Stairs Orthopedic Surgery [facility] [place]

## 2020-12-15 NOTE — Anesthesia Procedure Notes (Addendum)
Procedure Name: Intubation Date/Time: 12/15/2020 12:44 PM Performed by: Claris Che, CRNA Pre-anesthesia Checklist: Patient identified, Emergency Drugs available, Suction available and Patient being monitored Oxygen Delivery Method: Circle system utilized Preoxygenation: Pre-oxygenation with 100% oxygen Induction Type: IV induction Ventilation: Mask ventilation without difficulty Laryngoscope Size: Mac and 4 Grade View: Grade I Tube type: Oral Tube size: 7.5 mm Number of attempts: 2 Airway Equipment and Method: Stylet Placement Confirmation: ETT inserted through vocal cords under direct vision, positive ETCO2 and breath sounds checked- equal and bilateral Secured at: 24 cm Tube secured with: Tape Dental Injury: Teeth and Oropharynx as per pre-operative assessment  Comments: First attempt by SRNA with poor visualization due inability to sweep tongue out of view. Blade removed and patient masked. Patient intubated successfully with 2nd attempt by SRNA with MAC 4

## 2020-12-15 NOTE — Anesthesia Procedure Notes (Signed)
Anesthesia Regional Block: Interscalene brachial plexus block   Pre-Anesthetic Checklist: , timeout performed,  Correct Patient, Correct Site, Correct Laterality,  Correct Procedure, Correct Position, site marked,  Risks and benefits discussed,  Surgical consent,  Pre-op evaluation,  At surgeon's request and post-op pain management  Laterality: Right and Upper  Prep: chloraprep       Needles:  Injection technique: Single-shot      Needle Length: 5cm  Needle Gauge: 22     Additional Needles: Arrow StimuQuik ECHO Echogenic Stimulating PNB Needle  Procedures:,,,, ultrasound used (permanent image in chart),,    Narrative:  Start time: 12/15/2020 12:04 PM End time: 12/15/2020 12:11 PM Injection made incrementally with aspirations every 5 mL.  Performed by: Personally  Anesthesiologist: Oleta Mouse, MD

## 2020-12-15 NOTE — Anesthesia Postprocedure Evaluation (Signed)
Anesthesia Post Note  Patient: Cory Christensen  Procedure(s) Performed: TOTAL SHOULDER ARTHROPLASTY anatomic (Right: Shoulder)     Patient location during evaluation: PACU Anesthesia Type: General Level of consciousness: awake and alert Pain management: pain level controlled Vital Signs Assessment: post-procedure vital signs reviewed and stable Respiratory status: spontaneous breathing, nonlabored ventilation, respiratory function stable and patient connected to nasal cannula oxygen Cardiovascular status: blood pressure returned to baseline and stable Postop Assessment: no apparent nausea or vomiting Anesthetic complications: no   No notable events documented.  Last Vitals:  Vitals:   12/15/20 1615 12/15/20 1636  BP: 135/80 137/76  Pulse: 65 62  Resp: 16 16  Temp:    SpO2: 92% 92%    Last Pain:  Vitals:   12/15/20 1636  TempSrc:   PainSc: 0-No pain                 Tiajuana Amass

## 2020-12-16 ENCOUNTER — Encounter (HOSPITAL_COMMUNITY): Payer: Self-pay | Admitting: Orthopedic Surgery

## 2020-12-16 DIAGNOSIS — M19011 Primary osteoarthritis, right shoulder: Secondary | ICD-10-CM | POA: Diagnosis not present

## 2020-12-16 NOTE — Discharge Summary (Addendum)
Patient ID: Cory Christensen MRN: [id number] DOB/AGE: [birth year] 50 y.o.  Admit date: 12/15/2020 Discharge date: 12/16/2020  Primary Diagnosis: Right shoulder glenhumeral arthritis  Admission Diagnoses:  s/p Right total shoulder arthroplasty Past Medical History:  Diagnosis Date   Arthritis    GERD (gastroesophageal reflux disease)    History of kidney stones    Hypertension    Discharge Diagnoses:   Active Problems:   History of total shoulder replacement, right  Estimated body mass index is 39.37 kg/m as calculated from the following:   Height as of this encounter: 6\' 3"  (1.905 m).   Weight as of this encounter: 142.9 kg.  Procedure:  Procedure(s) (LRB): TOTAL SHOULDER ARTHROPLASTY anatomic (Right)   Consults: None  HPI: Cory Christensen is a 50 year old male presenting to the hospital for an elective right total shoulder anatomic arthroplasty.  He seen in our office prior where consent was obtained and plan was made for right anatomic total shoulder arthroplasty after failing conservative treatment.  Patient underwent surgery on 12/15/2020 and was admitted for postoperative monitoring and pain control.  Laboratory Data: Hospital Outpatient Visit on 12/10/2020  Component Date Value Ref Range Status   WBC 12/10/2020 7.7  4.0 - 10.5 K/uL Final   RBC 12/10/2020 4.99  4.22 - 5.81 MIL/uL Final   Hemoglobin 12/10/2020 14.3  13.0 - 17.0 g/dL Final   HCT 12/10/2020 43.4  39.0 - 52.0 % Final   MCV 12/10/2020 87.0  80.0 - 100.0 fL Final   MCH 12/10/2020 28.7  26.0 - 34.0 pg Final   MCHC 12/10/2020 32.9  30.0 - 36.0 g/dL Final   RDW 12/10/2020 12.6  11.5 - 15.5 % Final   Platelets 12/10/2020 236  150 - 400 K/uL Final   nRBC 12/10/2020 0.0  0.0 - 0.2 % Final   Performed at [facility], [place] [address] [place], [place] [address]   Sodium 12/10/2020 139  135 - 145 mmol/L Final   Potassium 12/10/2020 4.1  3.5 - 5.1 mmol/L Final   Chloride 12/10/2020 105  98 - 111 mmol/L Final   CO2  12/10/2020 27  22 - 32 mmol/L Final   Glucose, Bld 12/10/2020 143 (A) 70 - 99 mg/dL Final   Glucose reference range applies only to samples taken after fasting for at least 8 hours.   BUN 12/10/2020 13  6 - 20 mg/dL Final   Creatinine, Ser 12/10/2020 1.01  0.61 - 1.24 mg/dL Final   Calcium 12/10/2020 9.1  8.9 - 10.3 mg/dL Final   GFR, Estimated 12/10/2020 >60  >60 mL/min Final   Comment: (NOTE) Calculated using the CKD-EPI Creatinine Equation (2021)    Anion gap 12/10/2020 7  5 - 15 Final   Performed at [place] [address], [place], [place] [address]   MRSA, PCR 12/10/2020 NEGATIVE  NEGATIVE Final   Staphylococcus aureus 12/10/2020 POSITIVE (A) NEGATIVE Final   Comment: (NOTE) The Xpert SA Assay (FDA approved for NASAL specimens in patients 76 years of age and older), is one component of a comprehensive surveillance program. It is not intended to diagnose infection nor to guide or monitor treatment. Performed at [facility], [place] [address] [place], [place] [address]      X-Rays:DG Shoulder Right Port  Result Date: 12/15/2020 CLINICAL DATA:  Status post right shoulder replacement. EXAM: PORTABLE RIGHT SHOULDER COMPARISON:  Preoperative CT 10/21/2020 FINDINGS: Glenohumeral arthroplasty in expected alignment. No periprosthetic lucency or fracture. Acromioclavicular joint  is congruent. Recent postsurgical change includes air and edema in the joint space and soft tissues. IMPRESSION: Right shoulder arthroplasty without immediate postoperative complication. Electronically Signed   By: Keith Rake M.D.   On: 12/15/2020 16:39    EKG: Orders placed or performed in visit on 04/17/20   EKG 12-Lead     Hospital Course: Cory Christensen is a 50 y.o. who was admitted to Hospital. They were brought to the operating room on 12/15/2020 and underwent Procedure(s): TOTAL SHOULDER ARTHROPLASTY anatomic.  Patient tolerated the procedure well and was later transferred to the  recovery room and then to the orthopaedic floor for postoperative care.  They were given PO and IV analgesics for pain control following their surgery.  They were given 24 hours of postoperative antibiotics of  Anti-infectives (From admission, onward)    Start     Dose/Rate Route Frequency Ordered Stop   12/15/20 1900  ceFAZolin (ANCEF) IVPB 1 g/50 mL premix        1 g 100 mL/hr over 30 Minutes Intravenous Every 6 hours 12/15/20 1816 12/16/20 1041   12/15/20 1328  vancomycin (VANCOCIN) powder  Status:  Discontinued          As needed 12/15/20 1328 12/15/20 1523   12/15/20 1045  ceFAZolin (ANCEF) IVPB 3g/100 mL premix        3 g 200 mL/hr over 30 Minutes Intravenous On call to O.R. 12/15/20 1031 12/15/20 1257      and started on DVT prophylaxis in the form of Aspirin.    OT was ordered for total joint protocol.  Discharge planning consulted to help with postop disposition and equipment needs.  Patient had a uneventful night on the evening of surgery.  They started to get up OOB with therapy on day one.  Dressing was intact without drainage.  Patient's block was still active.  Advised to take pain medication once he got home.  This has been sent to his pharmacy.  Reviewed postoperative instructions and instructed patient to call the office to set up his 2-week postop.  Patient was seen in rounds and was ready to go home.   Diet: Regular diet Activity:NWB in sling.  Follow-up:in 2 weeks Disposition - Home Discharged Condition: good   Discharge Instructions     Call MD / Call 911   Complete by: As directed    If you experience chest pain or shortness of breath, CALL 911 and be transported to the hospital emergency room.  If you develope a fever above 101 F, pus (white drainage) or increased drainage or redness at the wound, or calf pain, call your surgeon's office.   Call MD / Call 911   Complete by: As directed    If you experience chest pain or shortness of breath, CALL 911 and be  transported to the hospital emergency room.  If you develope a fever above 101 F, pus (white drainage) or increased drainage or redness at the wound, or calf pain, call your surgeon's office.   Constipation Prevention   Complete by: As directed    Drink plenty of fluids.  Prune juice may be helpful.  You may use a stool softener, such as Colace (over the counter) 100 mg twice a day.  Use MiraLax (over the counter) for constipation as needed.   Constipation Prevention   Complete by: As directed    Drink plenty of fluids.  Prune juice may be helpful.  You may use a stool softener, such  as Colace (over the counter) 100 mg twice a day.  Use MiraLax (over the counter) for constipation as needed.   Diet - low sodium heart healthy   Complete by: As directed    Diet - low sodium heart healthy   Complete by: As directed    Increase activity slowly as tolerated   Complete by: As directed    Increase activity slowly as tolerated   Complete by: As directed    Post-operative opioid taper instructions:   Complete by: As directed    POST-OPERATIVE OPIOID TAPER INSTRUCTIONS: It is important to wean off of your opioid medication as soon as possible. If you do not need pain medication after your surgery it is ok to stop day one. Opioids include: Codeine, Hydrocodone(Norco, Vicodin), Oxycodone(Percocet, oxycontin) and hydromorphone amongst others.  Long term and even short term use of opiods can cause: Increased pain response Dependence Constipation Depression Respiratory depression And more.  Withdrawal symptoms can include Flu like symptoms Nausea, vomiting And more Techniques to manage these symptoms Hydrate well Eat regular healthy meals Stay active Use relaxation techniques(deep breathing, meditating, yoga) Do Not substitute Alcohol to help with tapering If you have been on opioids for less than two weeks and do not have pain than it is ok to stop all together.  Plan to wean off of  opioids This plan should start within one week post op of your joint replacement. Maintain the same interval or time between taking each dose and first decrease the dose.  Cut the total daily intake of opioids by one tablet each day Next start to increase the time between doses. The last dose that should be eliminated is the evening dose.      Post-operative opioid taper instructions:   Complete by: As directed    POST-OPERATIVE OPIOID TAPER INSTRUCTIONS: It is important to wean off of your opioid medication as soon as possible. If you do not need pain medication after your surgery it is ok to stop day one. Opioids include: Codeine, Hydrocodone(Norco, Vicodin), Oxycodone(Percocet, oxycontin) and hydromorphone amongst others.  Long term and even short term use of opiods can cause: Increased pain response Dependence Constipation Depression Respiratory depression And more.  Withdrawal symptoms can include Flu like symptoms Nausea, vomiting And more Techniques to manage these symptoms Hydrate well Eat regular healthy meals Stay active Use relaxation techniques(deep breathing, meditating, yoga) Do Not substitute Alcohol to help with tapering If you have been on opioids for less than two weeks and do not have pain than it is ok to stop all together.  Plan to wean off of opioids This plan should start within one week post op of your joint replacement. Maintain the same interval or time between taking each dose and first decrease the dose.  Cut the total daily intake of opioids by one tablet each day Next start to increase the time between doses. The last dose that should be eliminated is the evening dose.         Allergies as of 12/16/2020   No Known Allergies      Medication List     STOP taking these medications    aspirin EC 81 MG tablet       TAKE these medications    cetirizine 10 MG tablet Commonly known as: ZYRTEC Take 10 mg by mouth daily as needed for  allergies.   ibuprofen 200 MG tablet Commonly known as: ADVIL Take 400 mg by mouth every 6 (six) hours as needed  for headache or moderate pain.   lisinopril 10 MG tablet Commonly known as: ZESTRIL Take 10 mg by mouth daily.   methocarbamol 500 MG tablet Commonly known as: Robaxin Take 1 tablet (500 mg total) by mouth every 6 (six) hours as needed for muscle spasms.   mometasone 50 MCG/ACT nasal spray Commonly known as: NASONEX Place 2 sprays into the nose daily as needed (allergies).   omeprazole 40 MG capsule Commonly known as: PRILOSEC Take 40 mg by mouth daily.   ondansetron 4 MG disintegrating tablet Commonly known as: Zofran ODT Take 1 tablet (4 mg total) by mouth every 8 (eight) hours as needed.   oxyCODONE 5 MG immediate release tablet Commonly known as: Roxicodone Take 1 tablet (5 mg total) by mouth every 4 (four) hours as needed for severe pain or breakthrough pain.        Follow-up Information     Nicholes Stairs, MD Follow up in 2 week(s).   Specialty: Orthopedic Surgery Why: For wound re-check Contact information: [address] [place] [address] [phone]                 Signed: Jonelle Sidle PA-C  Orthopaedic Surgery 12/16/2020, 12:46 PM

## 2020-12-16 NOTE — Progress Notes (Signed)
Patient is discharged from room 3C10 at this time. Alert and in stable condition. IV site d/c'd and instructions read to patient and spouse with understanding verbalized and all questions answered. Left unit via wheelchiar with all belongings at side.

## 2020-12-16 NOTE — Evaluation (Signed)
Occupational Therapy Evaluation Patient Details Name: Cory Christensen MRN: [id number] DOB: 1970/07/02 Today's Date: 12/16/2020    History of Present Illness 50 year old male presenting to the hospital for an elective right total shoulder anatomic arthroplasty.  PMH includes: arthritis, GERD, HTN.   Clinical Impression   Patient admitted for the diagnosis and procedure above.  Patient understands all precautions and HEP reviewed, but he was unable to perfrom due to the continued effects of the surgical block.  He does have gross grasp, but is unable to actively move his R UE.  Plan is for discharge home today with assist as needed from his spouse.      Follow Up Recommendations  Follow surgeon's recommendation for DC plan and follow-up therapies    Equipment Recommendations  None recommended by OT    Recommendations for Other Services       Precautions / Restrictions Precautions Precautions: Shoulder Type of Shoulder Precautions: No AROM or PROM at the shoulder.  AROM to elbow distal. Shoulder Interventions: Shoulder sling/immobilizer;Off for dressing/bathing/exercises Precaution Booklet Issued: Yes (comment) Precaution Comments: reviewed and patient verbalized understanding Required Braces or Orthoses: Sling Restrictions Weight Bearing Restrictions: Yes RUE Weight Bearing: Non weight bearing      Mobility Bed Mobility Overal bed mobility: Modified Independent               Patient Response: Cooperative  Transfers Overall transfer level: Independent                    Balance Overall balance assessment: Mild deficits observed, not formally tested                                         ADL either performed or assessed with clinical judgement   ADL Overall ADL's : At baseline                                       General ADL Comments: Expected assist with UB dressing given surgery.  Min A     Vision Patient Visual  Report: No change from baseline       Perception     Praxis      Pertinent Vitals/Pain Pain Assessment: Faces Faces Pain Scale: Hurts little more Pain Location: R shoulder Pain Descriptors / Indicators: Aching;Operative site guarding Pain Intervention(s): Monitored during session     Hand Dominance Right   Extremity/Trunk Assessment Upper Extremity Assessment Upper Extremity Assessment: RUE deficits/detail RUE Deficits / Details: surgical restrictions RUE: Unable to fully assess due to immobilization RUE Sensation: decreased light touch RUE Coordination: decreased gross motor   Lower Extremity Assessment Lower Extremity Assessment: Overall WFL for tasks assessed   Cervical / Trunk Assessment Cervical / Trunk Assessment: Normal   Communication Communication Communication: No difficulties   Cognition Arousal/Alertness: Awake/alert Behavior During Therapy: WFL for tasks assessed/performed Overall Cognitive Status: Within Functional Limits for tasks assessed                                                Shoulder Instructions      Home Living Family/patient expects to be discharged to:: Private residence Living Arrangements: Spouse/significant other;Children Available  Help at Discharge: Family;Available 24 hours/day Type of Home: House Home Access: Level entry     Home Layout: One level     Bathroom Shower/Tub: Teacher, early years/pre: Standard     Home Equipment: None          Prior Functioning/Environment Level of Independence: Independent                 OT Problem List: Decreased range of motion;Pain      OT Treatment/Interventions:      OT Goals(Current goals can be found in the care plan section) Acute Rehab OT Goals Patient Stated Goal: Return home OT Goal Formulation: With patient Time For Goal Achievement: 12/16/20 Potential to Achieve Goals: Good  OT Frequency:     Barriers to D/C:             Co-evaluation              AM-PAC OT "6 Clicks" Daily Activity     Outcome Measure Help from another person eating meals?: None Help from another person taking care of personal grooming?: None Help from another person toileting, which includes using toliet, bedpan, or urinal?: None Help from another person bathing (including washing, rinsing, drying)?: A Little Help from another person to put on and taking off regular upper body clothing?: A Little Help from another person to put on and taking off regular lower body clothing?: A Little 6 Click Score: 21   End of Session Nurse Communication: Mobility status  Activity Tolerance: Patient tolerated treatment well Patient left: in chair;with call bell/phone within reach;with family/visitor present  OT Visit Diagnosis: Pain Pain - Right/Left: Right Pain - part of body: Shoulder                Time: 1046-1105 OT Time Calculation (min): 19 min Charges:  OT General Charges $OT Visit: 1 Visit OT Evaluation $OT Eval Moderate Complexity: 1 Mod  12/16/2020  Rich, OTR/L  Acute Rehabilitation Services  Office:  [place] 12/16/2020, 1:31 PM

## 2020-12-29 ENCOUNTER — Ambulatory Visit: Payer: BC Managed Care – PPO

## 2020-12-30 ENCOUNTER — Ambulatory Visit: Payer: BC Managed Care – PPO | Attending: Physician Assistant

## 2020-12-30 DIAGNOSIS — M25611 Stiffness of right shoulder, not elsewhere classified: Secondary | ICD-10-CM | POA: Diagnosis present

## 2020-12-30 DIAGNOSIS — Z96611 Presence of right artificial shoulder joint: Secondary | ICD-10-CM | POA: Diagnosis not present
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
## 2021-01-05 ENCOUNTER — Ambulatory Visit: Payer: BC Managed Care – PPO

## 2021-01-05 DIAGNOSIS — Z96611 Presence of right artificial shoulder joint: Secondary | ICD-10-CM

## 2021-01-05 DIAGNOSIS — M25611 Stiffness of right shoulder, not elsewhere classified: Secondary | ICD-10-CM
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
## 2021-01-07 ENCOUNTER — Ambulatory Visit: Payer: BC Managed Care – PPO

## 2021-01-07 DIAGNOSIS — Z96611 Presence of right artificial shoulder joint: Secondary | ICD-10-CM | POA: Diagnosis not present

## 2021-01-07 DIAGNOSIS — M25611 Stiffness of right shoulder, not elsewhere classified: Secondary | ICD-10-CM
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
## 2021-01-12 ENCOUNTER — Ambulatory Visit: Payer: BC Managed Care – PPO | Attending: Physician Assistant

## 2021-01-12 DIAGNOSIS — M25611 Stiffness of right shoulder, not elsewhere classified: Secondary | ICD-10-CM | POA: Insufficient documentation

## 2021-01-12 DIAGNOSIS — Z96611 Presence of right artificial shoulder joint: Secondary | ICD-10-CM | POA: Insufficient documentation
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
## 2021-01-12 NOTE — Patient Instructions (Signed)
Access Code: [email or web address] URL: [email or web address] Date: 01/12/2021 Prepared by: Joneen Boers  Exercises Isometric Shoulder External Rotation - 1 x daily - 7 x weekly - 3 sets - 10 reps - 5 seconds hold

## 2021-01-14 ENCOUNTER — Ambulatory Visit: Payer: BC Managed Care – PPO

## 2021-01-14 DIAGNOSIS — M25611 Stiffness of right shoulder, not elsewhere classified: Secondary | ICD-10-CM

## 2021-01-14 DIAGNOSIS — Z96611 Presence of right artificial shoulder joint: Secondary | ICD-10-CM | POA: Diagnosis not present
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
## 2021-01-19 ENCOUNTER — Ambulatory Visit: Payer: BC Managed Care – PPO

## 2021-01-19 DIAGNOSIS — Z96611 Presence of right artificial shoulder joint: Secondary | ICD-10-CM | POA: Diagnosis not present

## 2021-01-19 DIAGNOSIS — M25611 Stiffness of right shoulder, not elsewhere classified: Secondary | ICD-10-CM
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
## 2021-01-21 ENCOUNTER — Ambulatory Visit: Payer: BC Managed Care – PPO

## 2021-01-21 DIAGNOSIS — Z96611 Presence of right artificial shoulder joint: Secondary | ICD-10-CM

## 2021-01-21 DIAGNOSIS — M25611 Stiffness of right shoulder, not elsewhere classified: Secondary | ICD-10-CM
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
## 2021-01-26 ENCOUNTER — Ambulatory Visit: Payer: BC Managed Care – PPO

## 2021-01-26 DIAGNOSIS — Z96611 Presence of right artificial shoulder joint: Secondary | ICD-10-CM | POA: Diagnosis not present

## 2021-01-26 DIAGNOSIS — M25611 Stiffness of right shoulder, not elsewhere classified: Secondary | ICD-10-CM
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
## 2021-01-28 ENCOUNTER — Ambulatory Visit: Payer: BC Managed Care – PPO

## 2021-01-28 DIAGNOSIS — Z96611 Presence of right artificial shoulder joint: Secondary | ICD-10-CM

## 2021-01-28 DIAGNOSIS — M25611 Stiffness of right shoulder, not elsewhere classified: Secondary | ICD-10-CM
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
## 2021-02-02 ENCOUNTER — Ambulatory Visit: Payer: BC Managed Care – PPO

## 2021-02-02 DIAGNOSIS — M25611 Stiffness of right shoulder, not elsewhere classified: Secondary | ICD-10-CM

## 2021-02-02 DIAGNOSIS — Z96611 Presence of right artificial shoulder joint: Secondary | ICD-10-CM
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
## 2021-02-04 ENCOUNTER — Ambulatory Visit: Payer: BC Managed Care – PPO

## 2021-02-04 ENCOUNTER — Other Ambulatory Visit: Payer: Self-pay

## 2021-02-04 DIAGNOSIS — M25611 Stiffness of right shoulder, not elsewhere classified: Secondary | ICD-10-CM

## 2021-02-04 DIAGNOSIS — Z96611 Presence of right artificial shoulder joint: Secondary | ICD-10-CM | POA: Diagnosis not present
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
## 2021-02-08 ENCOUNTER — Ambulatory Visit: Payer: BC Managed Care – PPO

## 2021-02-08 DIAGNOSIS — M25611 Stiffness of right shoulder, not elsewhere classified: Secondary | ICD-10-CM

## 2021-02-08 DIAGNOSIS — Z96611 Presence of right artificial shoulder joint: Secondary | ICD-10-CM | POA: Diagnosis not present

## 2021-02-08 NOTE — Patient Instructions (Signed)
Access Code: [email or web address] URL: [email or web address] Date: 02/08/2021 Prepared by: Joneen Boers  Exercises Supine Shoulder Flexion AAROM - 2 x daily - 7 x weekly - 2 sets - 15 reps - 5 seconds hold Shoulder Scaption AAROM with [place] - 2 x daily - 7 x weekly - 2 sets - 15 reps - 5 seconds hold
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
## 2021-02-11 ENCOUNTER — Ambulatory Visit: Payer: BC Managed Care – PPO | Attending: Physician Assistant

## 2021-02-11 DIAGNOSIS — M25611 Stiffness of right shoulder, not elsewhere classified: Secondary | ICD-10-CM | POA: Diagnosis present

## 2021-02-11 DIAGNOSIS — Z96611 Presence of right artificial shoulder joint: Secondary | ICD-10-CM | POA: Diagnosis present
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
## 2021-02-17 ENCOUNTER — Ambulatory Visit: Payer: BC Managed Care – PPO

## 2021-02-17 DIAGNOSIS — M25611 Stiffness of right shoulder, not elsewhere classified: Secondary | ICD-10-CM

## 2021-02-17 DIAGNOSIS — Z96611 Presence of right artificial shoulder joint: Secondary | ICD-10-CM
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
## 2021-02-22 ENCOUNTER — Ambulatory Visit: Payer: BC Managed Care – PPO

## 2021-02-22 DIAGNOSIS — M25611 Stiffness of right shoulder, not elsewhere classified: Secondary | ICD-10-CM

## 2021-02-22 DIAGNOSIS — Z96611 Presence of right artificial shoulder joint: Secondary | ICD-10-CM
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
## 2021-02-24 ENCOUNTER — Ambulatory Visit: Payer: BC Managed Care – PPO

## 2021-03-01 ENCOUNTER — Ambulatory Visit: Payer: BC Managed Care – PPO

## 2021-03-01 DIAGNOSIS — M25611 Stiffness of right shoulder, not elsewhere classified: Secondary | ICD-10-CM | POA: Diagnosis not present

## 2021-03-01 DIAGNOSIS — Z96611 Presence of right artificial shoulder joint: Secondary | ICD-10-CM
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
## 2021-03-01 NOTE — Patient Instructions (Signed)
Access Code: [email or web address] URL: [email or web address] Date: 03/01/2021 Prepared by: Joneen Boers  Exercises Supine Shoulder Flexion AAROM - 2 x daily - 7 x weekly - 2 sets - 15 reps - 5 seconds hold Shoulder Scaption AAROM with [place] - 2 x daily - 7 x weekly - 2 sets - 15 reps - 5 seconds hold Scapular Retraction with Resistance - 1 x daily - 7 x weekly - 3 sets - 10 reps - 5 seconds hold

## 2021-03-04 ENCOUNTER — Ambulatory Visit: Payer: BC Managed Care – PPO

## 2021-03-04 DIAGNOSIS — M25611 Stiffness of right shoulder, not elsewhere classified: Secondary | ICD-10-CM | POA: Diagnosis not present

## 2021-03-04 DIAGNOSIS — Z96611 Presence of right artificial shoulder joint: Secondary | ICD-10-CM
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
## 2021-03-04 NOTE — Patient Instructions (Signed)
Access Code: [email or web address] URL: [email or web address] Date: 03/04/2021 Prepared by: Joneen Boers  Exercises Supine Shoulder Flexion AAROM - 2 x daily - 7 x weekly - 2 sets - 15 reps - 5 seconds hold Shoulder Scaption AAROM with [place] - 2 x daily - 7 x weekly - 2 sets - 15 reps - 5 seconds hold Scapular Retraction with Resistance - 1 x daily - 7 x weekly - 3 sets - 10 reps - 5 seconds hold Dynamic Hug with Resistance - 1 x daily - 7 x weekly - 3 sets - 10 reps - 5 seconds hold Standing Shoulder External Rotation with Resistance - 1 x daily - 7 x weekly - 3 sets - 10 reps

## 2021-03-07 ENCOUNTER — Encounter: Payer: Self-pay | Admitting: Emergency Medicine

## 2021-03-07 ENCOUNTER — Emergency Department
Admission: EM | Admit: 2021-03-07 | Discharge: 2021-03-07 | Disposition: A | Discharge status: Home / Self Care | Payer: BC Managed Care – PPO | Attending: Emergency Medicine | Admitting: Emergency Medicine

## 2021-03-07 ENCOUNTER — Other Ambulatory Visit: Payer: Self-pay

## 2021-03-07 DIAGNOSIS — W268XXA Contact with other sharp object(s), not elsewhere classified, initial encounter: Secondary | ICD-10-CM | POA: Diagnosis not present

## 2021-03-07 DIAGNOSIS — S61211A Laceration without foreign body of left index finger without damage to nail, initial encounter: Secondary | ICD-10-CM | POA: Insufficient documentation

## 2021-03-07 DIAGNOSIS — Z79899 Other long term (current) drug therapy: Secondary | ICD-10-CM | POA: Insufficient documentation

## 2021-03-07 DIAGNOSIS — S6992XA Unspecified injury of left wrist, hand and finger(s), initial encounter: Secondary | ICD-10-CM | POA: Diagnosis present

## 2021-03-07 DIAGNOSIS — R059 Cough, unspecified: Secondary | ICD-10-CM | POA: Insufficient documentation

## 2021-03-07 DIAGNOSIS — Z96611 Presence of right artificial shoulder joint: Secondary | ICD-10-CM | POA: Insufficient documentation

## 2021-03-07 DIAGNOSIS — I10 Essential (primary) hypertension: Secondary | ICD-10-CM | POA: Insufficient documentation

## 2021-03-07 MED ORDER — LIDOCAINE HCL (PF) 1 % IJ SOLN
10.0000 mL | Freq: Once | INTRAMUSCULAR | Status: AC
  Administered 2021-03-07: 10 mL
  Filled 2021-03-07: qty 10

## 2021-03-07 MED ORDER — CEPHALEXIN 500 MG PO CAPS: 1000.0000 mg | ORAL_CAPSULE | Freq: Two times a day (BID) | ORAL | 0 refills | Status: AC

## 2021-03-07 MED ORDER — GUAIFENESIN-CODEINE 100-10 MG/5ML PO SYRP: 5.0000 mL | ORAL_SOLUTION | Freq: Three times a day (TID) | ORAL | 0 refills | Status: AC | PRN

## 2021-03-07 MED ORDER — PREDNISONE 50 MG PO TABS: 50.0000 mg | ORAL_TABLET | Freq: Every day | ORAL | 0 refills | Status: AC
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
## 2021-03-07 NOTE — ED Notes (Signed)
See triage note. Pt stating he was reaching for a tool and an axe sliced his left index finger. Bleeding controlled at this time and wrapped.

## 2021-03-07 NOTE — ED Triage Notes (Signed)
Pt reports that he was putting away tools and hit his knuckle of his right index finger on a bush axe. Laceration on right knuckle. Bleeding is controlled.

## 2021-03-07 NOTE — ED Provider Notes (Signed)
Emergency Medicine Provider Triage Evaluation Note  Nicolasa Ducking , a 50 y.o. male  was evaluated in triage.  Pt complains of right index finger laceration. He accidentally swiped his hand across a brush axe he had in a rack on the wall. He denies any other injury.   Review of Systems  Positive: Right index finger lac Negative: FCS  Physical Exam  BP (!) 145/95 (BP Location: Left Arm)   Pulse 88   Temp 98.6 F (37 C) (Oral)   Resp 20   Ht 6\' 3"  (1.905 m)   Wt (!) 142.9 kg   SpO2 98%   BMI 39.38 kg/m  Gen:   Awake, no distress  NAD Resp:  Normal effort CTA MSK:   Moves extremities without difficulty Right index finger laceration with dressing in place Other:  CVS: RRR normal distal pulses  Medical Decision Making  Medically screening exam initiated at 5:11 PM.  Appropriate orders placed.  Nicolasa Ducking was informed that the remainder of the evaluation will be completed by another provider, this initial triage assessment does not replace that evaluation, and the importance of remaining in the ED until their evaluation is complete.  Patient with ED evaluation of an accidental laceration to the right index finger.    Melvenia Needles, PA-C 03/07/21 1713    Duffy Bruce, MD 03/08/21 [phone]

## 2021-03-08 ENCOUNTER — Ambulatory Visit: Payer: BC Managed Care – PPO

## 2021-03-08 DIAGNOSIS — Z96611 Presence of right artificial shoulder joint: Secondary | ICD-10-CM

## 2021-03-08 DIAGNOSIS — M25611 Stiffness of right shoulder, not elsewhere classified: Secondary | ICD-10-CM | POA: Diagnosis not present
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
## 2021-03-10 ENCOUNTER — Ambulatory Visit: Payer: BC Managed Care – PPO

## 2021-03-10 DIAGNOSIS — Z96611 Presence of right artificial shoulder joint: Secondary | ICD-10-CM

## 2021-03-10 DIAGNOSIS — M25611 Stiffness of right shoulder, not elsewhere classified: Secondary | ICD-10-CM | POA: Diagnosis not present
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
## 2021-03-15 ENCOUNTER — Ambulatory Visit: Payer: BC Managed Care – PPO

## 2021-03-17 ENCOUNTER — Ambulatory Visit: Payer: BC Managed Care – PPO

## 2021-03-22 ENCOUNTER — Ambulatory Visit: Payer: BC Managed Care – PPO

## 2021-03-23 ENCOUNTER — Ambulatory Visit: Payer: BC Managed Care – PPO | Attending: Physician Assistant

## 2021-03-23 DIAGNOSIS — M25611 Stiffness of right shoulder, not elsewhere classified: Secondary | ICD-10-CM | POA: Diagnosis not present

## 2021-03-23 DIAGNOSIS — Z96611 Presence of right artificial shoulder joint: Secondary | ICD-10-CM | POA: Diagnosis present
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
## 2021-03-25 ENCOUNTER — Ambulatory Visit: Payer: BC Managed Care – PPO

## 2021-03-25 DIAGNOSIS — M25611 Stiffness of right shoulder, not elsewhere classified: Secondary | ICD-10-CM | POA: Diagnosis not present

## 2021-03-25 DIAGNOSIS — Z96611 Presence of right artificial shoulder joint: Secondary | ICD-10-CM
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
## 2021-03-30 ENCOUNTER — Ambulatory Visit: Payer: BC Managed Care – PPO

## 2021-03-30 DIAGNOSIS — Z96611 Presence of right artificial shoulder joint: Secondary | ICD-10-CM

## 2021-03-30 DIAGNOSIS — M25611 Stiffness of right shoulder, not elsewhere classified: Secondary | ICD-10-CM | POA: Diagnosis not present
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
## 2021-04-01 ENCOUNTER — Ambulatory Visit: Payer: BC Managed Care – PPO

## 2021-04-01 DIAGNOSIS — M25611 Stiffness of right shoulder, not elsewhere classified: Secondary | ICD-10-CM

## 2021-04-01 DIAGNOSIS — Z96611 Presence of right artificial shoulder joint: Secondary | ICD-10-CM

## 2021-04-01 IMAGING — CR DG CHEST 2V
1 series · 2 of 2 positions shown · non-contrast
Comparison: March 15, 2018

CLINICAL DATA: Chest tightness

EXAM:
CHEST - 2 VIEW

[Series 1: dg chest 2 view · 0.14mm/px · 2 of 2 slices shown]
[im 1/2]
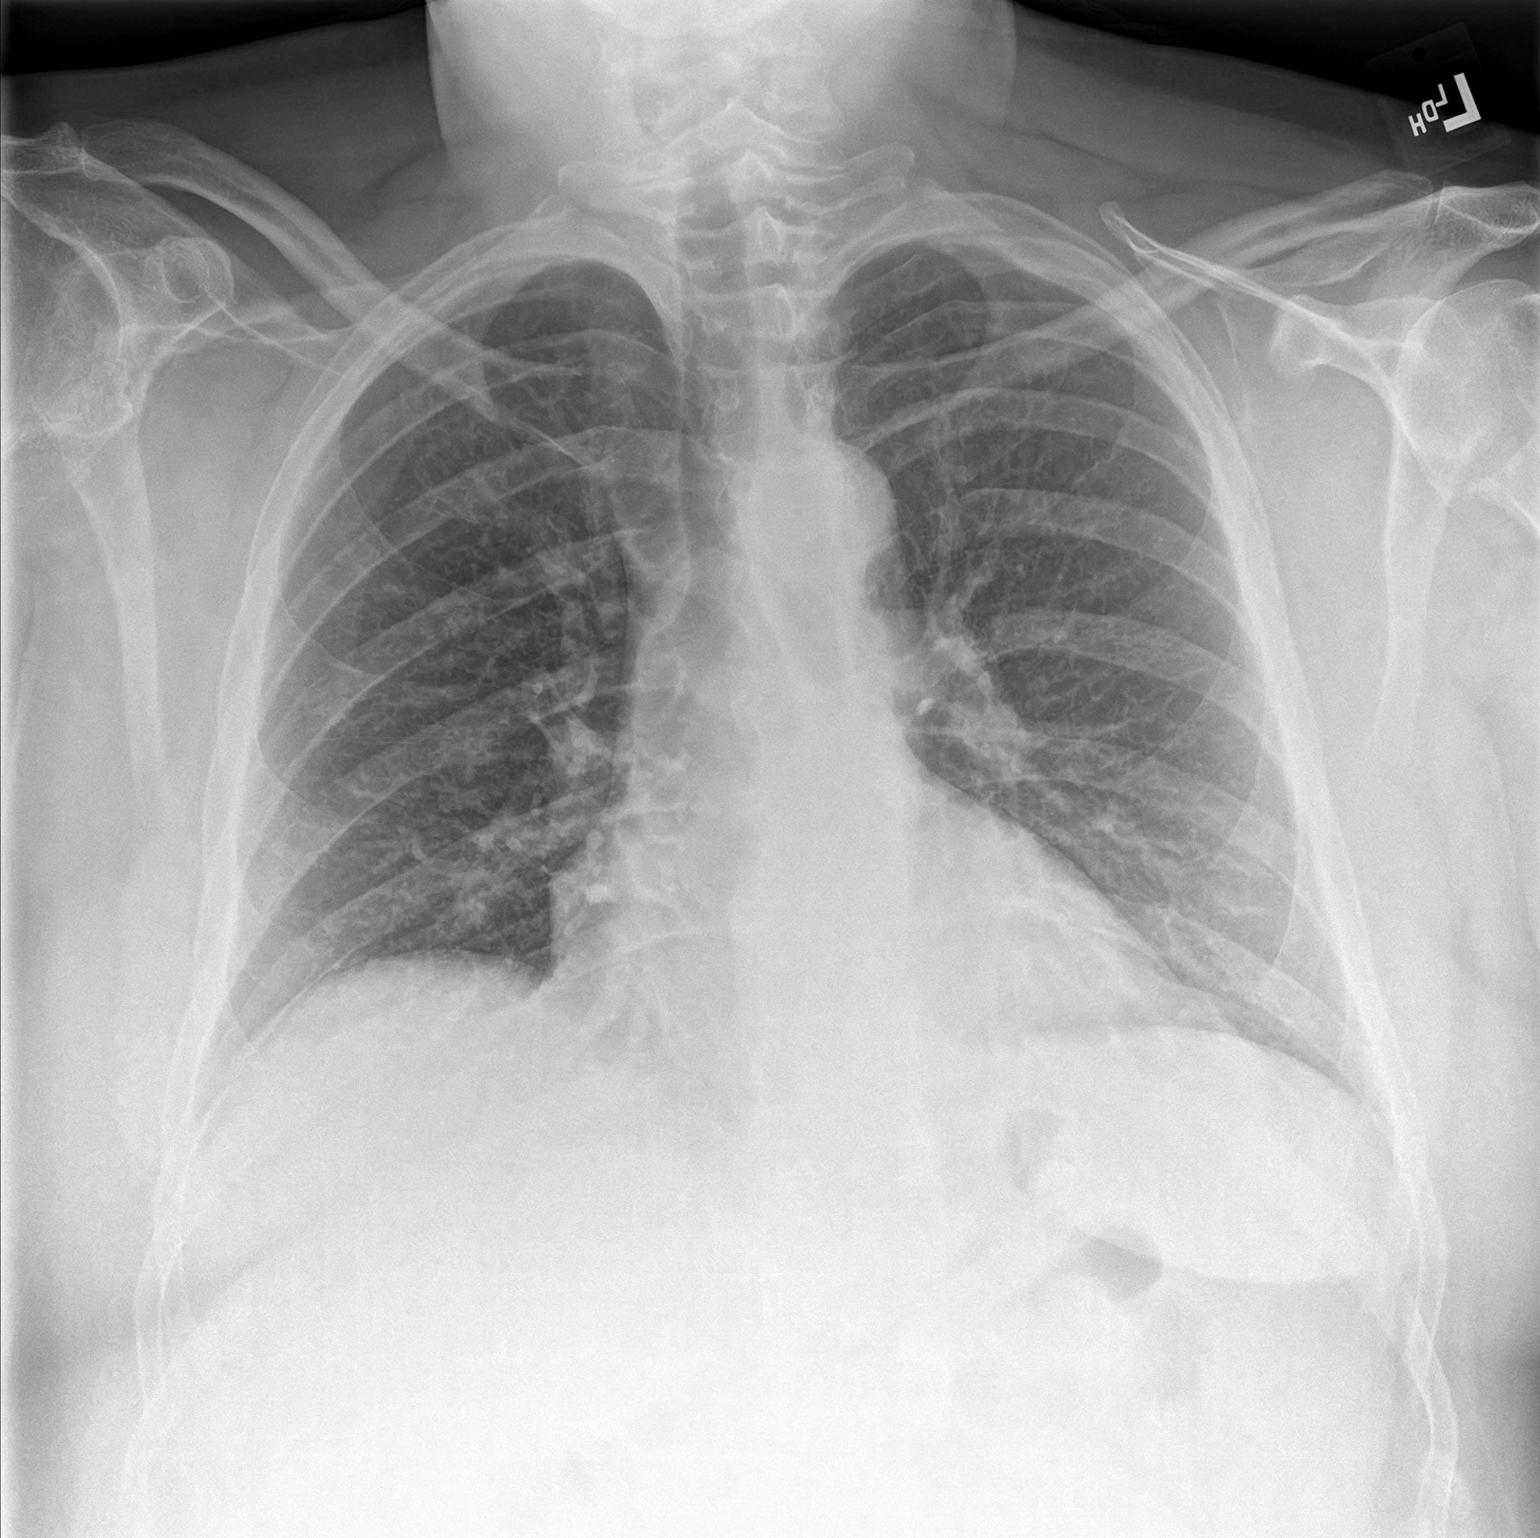
[im 2/2]
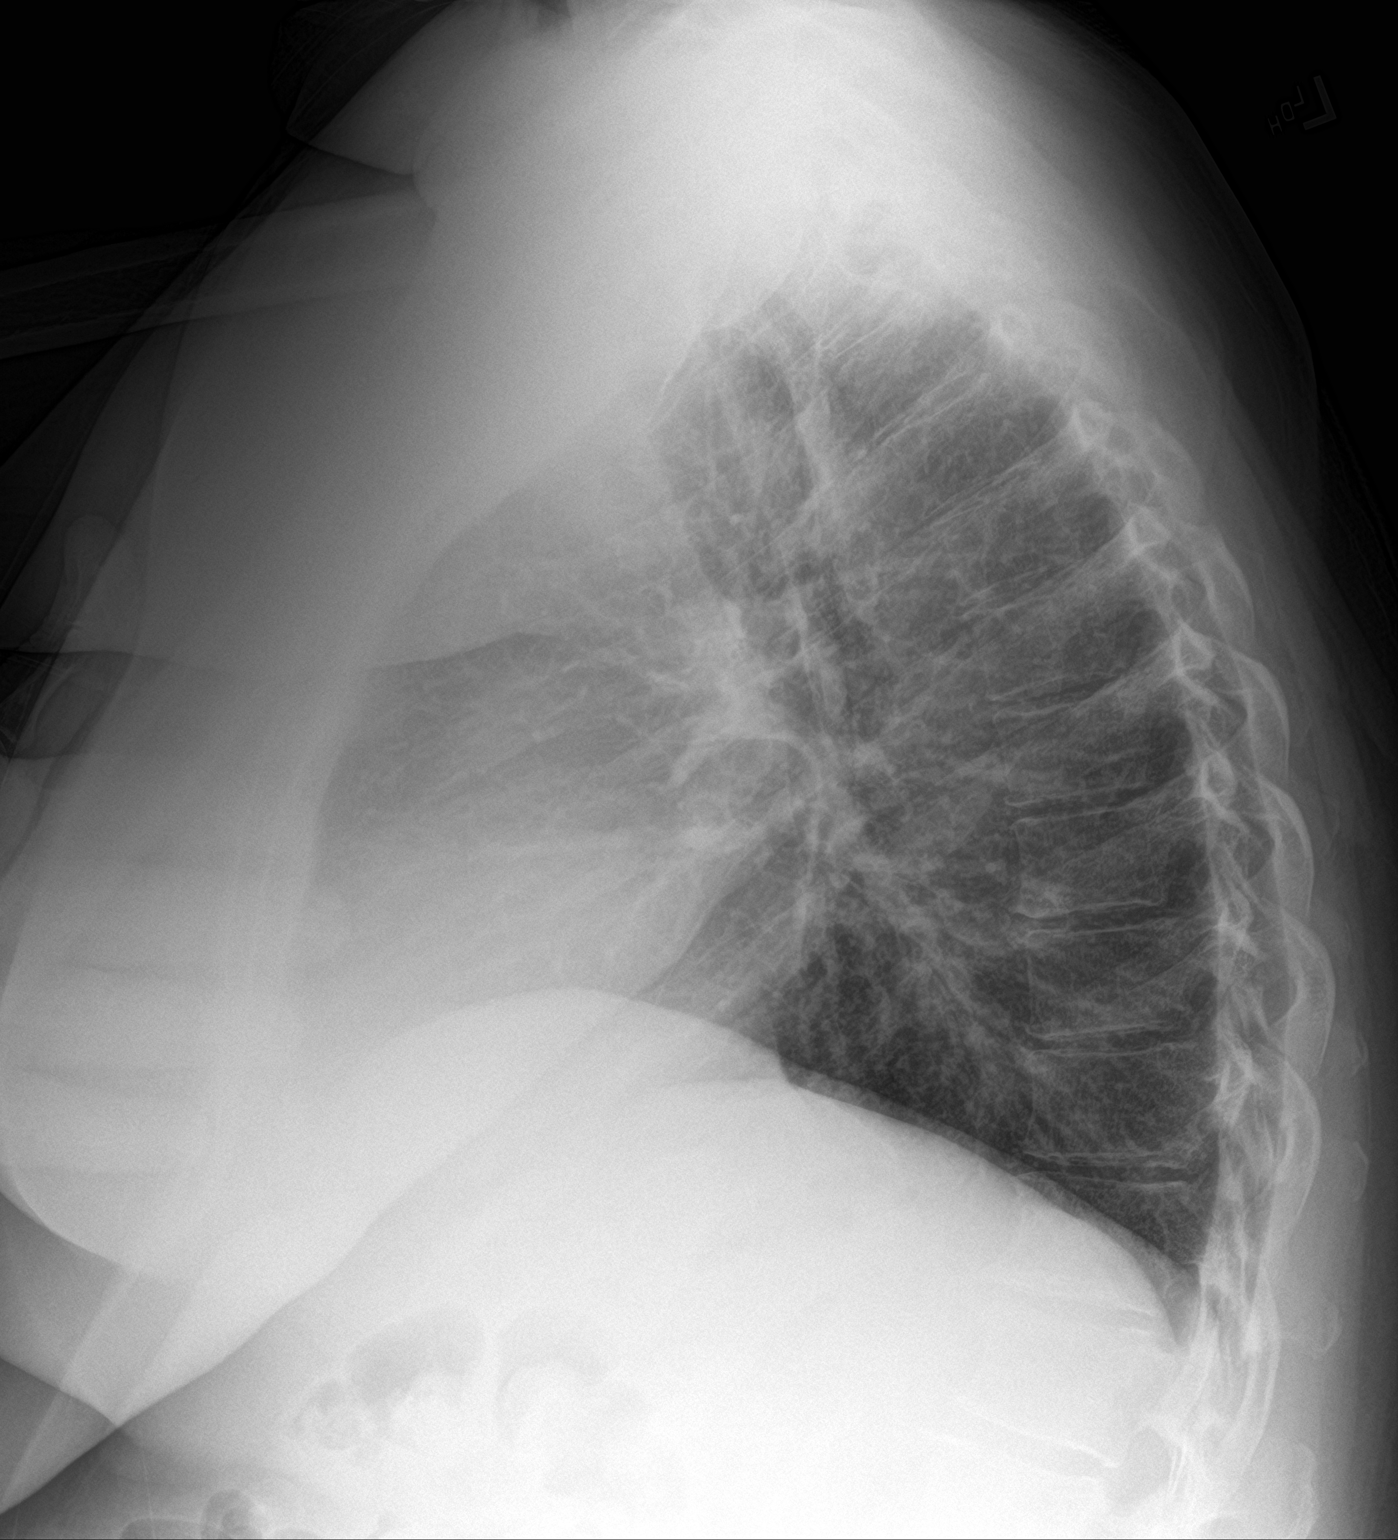

[2 of 2 positions shown; findings below may reference images not displayed]

FINDINGS: The cardiomediastinal silhouette is unchanged in contour. No pleural
effusion. No pneumothorax. No acute pleuroparenchymal abnormality.
Visualized abdomen is unremarkable. Mild degenerative changes of the
thoracic spine.
IMPRESSION: No acute cardiopulmonary abnormality.
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
## 2021-04-06 ENCOUNTER — Ambulatory Visit: Payer: BC Managed Care – PPO

## 2021-04-06 DIAGNOSIS — M25611 Stiffness of right shoulder, not elsewhere classified: Secondary | ICD-10-CM | POA: Diagnosis not present

## 2021-04-06 DIAGNOSIS — Z96611 Presence of right artificial shoulder joint: Secondary | ICD-10-CM
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
## 2021-04-13 ENCOUNTER — Ambulatory Visit: Payer: BC Managed Care – PPO | Attending: Physician Assistant

## 2021-04-13 DIAGNOSIS — Z96611 Presence of right artificial shoulder joint: Secondary | ICD-10-CM | POA: Diagnosis present

## 2021-04-13 DIAGNOSIS — M25611 Stiffness of right shoulder, not elsewhere classified: Secondary | ICD-10-CM | POA: Insufficient documentation
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
## 2021-04-15 ENCOUNTER — Ambulatory Visit: Payer: BC Managed Care – PPO

## 2021-04-15 DIAGNOSIS — M25611 Stiffness of right shoulder, not elsewhere classified: Secondary | ICD-10-CM

## 2021-04-15 DIAGNOSIS — Z96611 Presence of right artificial shoulder joint: Secondary | ICD-10-CM
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
## 2021-04-20 ENCOUNTER — Ambulatory Visit: Payer: BC Managed Care – PPO

## 2021-04-20 DIAGNOSIS — M25611 Stiffness of right shoulder, not elsewhere classified: Secondary | ICD-10-CM

## 2021-04-20 DIAGNOSIS — Z96611 Presence of right artificial shoulder joint: Secondary | ICD-10-CM
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
## 2021-04-22 ENCOUNTER — Ambulatory Visit: Payer: BC Managed Care – PPO

## 2021-04-22 DIAGNOSIS — M25611 Stiffness of right shoulder, not elsewhere classified: Secondary | ICD-10-CM | POA: Diagnosis not present

## 2021-04-22 DIAGNOSIS — Z96611 Presence of right artificial shoulder joint: Secondary | ICD-10-CM
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
## 2021-04-27 ENCOUNTER — Ambulatory Visit: Payer: BC Managed Care – PPO

## 2023-05-30 ENCOUNTER — Encounter (INDEPENDENT_AMBULATORY_CARE_PROVIDER_SITE_OTHER): Payer: Self-pay | Admitting: *Deleted

## 2024-01-11 ENCOUNTER — Encounter (INDEPENDENT_AMBULATORY_CARE_PROVIDER_SITE_OTHER): Payer: Self-pay | Admitting: *Deleted
